# Patient Record
Sex: Female | Born: 1975 | Race: White | Hispanic: No | Marital: Married | Smoking: Never smoker
Health system: Southern US, Community
[De-identification: ages and names within clinical notes are randomized; demographics above are authoritative.]

## PROBLEM LIST (undated history)

## (undated) DIAGNOSIS — F32A Depression, unspecified: Secondary | ICD-10-CM

## (undated) DIAGNOSIS — D649 Anemia, unspecified: Secondary | ICD-10-CM

## (undated) DIAGNOSIS — F419 Anxiety disorder, unspecified: Secondary | ICD-10-CM

## (undated) DIAGNOSIS — T8859XA Other complications of anesthesia, initial encounter: Secondary | ICD-10-CM

## (undated) DIAGNOSIS — G4700 Insomnia, unspecified: Secondary | ICD-10-CM

## (undated) DIAGNOSIS — F329 Major depressive disorder, single episode, unspecified: Secondary | ICD-10-CM

## (undated) DIAGNOSIS — T4145XA Adverse effect of unspecified anesthetic, initial encounter: Secondary | ICD-10-CM

## (undated) HISTORY — DX: Anxiety disorder, unspecified: F41.9

## (undated) HISTORY — DX: Depression, unspecified: F32.A

## (undated) HISTORY — PX: TUBAL LIGATION: SHX77

## (undated) HISTORY — PX: NASAL SINUS SURGERY: SHX719

## (undated) HISTORY — DX: Insomnia, unspecified: G47.00

## (undated) HISTORY — PX: OTHER SURGICAL HISTORY: SHX169

---
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
## 2004-11-16 ENCOUNTER — Other Ambulatory Visit: Admission: RE | Admit: 2004-11-16 | Discharge: 2004-11-16 | Payer: Self-pay | Admitting: Obstetrics and Gynecology

## 2005-04-15 ENCOUNTER — Inpatient Hospital Stay (HOSPITAL_COMMUNITY): Admission: AD | Admit: 2005-04-15 | Discharge: 2005-04-16 | Payer: Self-pay | Admitting: Obstetrics and Gynecology

## 2005-05-31 ENCOUNTER — Inpatient Hospital Stay (HOSPITAL_COMMUNITY): Admission: AD | Admit: 2005-05-31 | Discharge: 2005-05-31 | Payer: Self-pay | Admitting: Obstetrics and Gynecology

## 2005-06-02 ENCOUNTER — Inpatient Hospital Stay (HOSPITAL_COMMUNITY): Admission: AD | Admit: 2005-06-02 | Discharge: 2005-06-04 | Payer: Self-pay | Admitting: Obstetrics and Gynecology

## 2006-08-09 ENCOUNTER — Inpatient Hospital Stay (HOSPITAL_COMMUNITY): Admission: AD | Admit: 2006-08-09 | Discharge: 2006-08-09 | Payer: Self-pay | Admitting: Obstetrics and Gynecology

## 2007-02-16 ENCOUNTER — Inpatient Hospital Stay (HOSPITAL_COMMUNITY): Admission: AD | Admit: 2007-02-16 | Discharge: 2007-02-16 | Payer: Self-pay | Admitting: Obstetrics and Gynecology

## 2007-03-10 ENCOUNTER — Encounter (INDEPENDENT_AMBULATORY_CARE_PROVIDER_SITE_OTHER): Payer: Self-pay | Admitting: Obstetrics and Gynecology

## 2007-03-10 ENCOUNTER — Inpatient Hospital Stay (HOSPITAL_COMMUNITY): Admission: AD | Admit: 2007-03-10 | Discharge: 2007-03-12 | Payer: Self-pay | Admitting: Obstetrics and Gynecology

## 2007-03-11 ENCOUNTER — Encounter (INDEPENDENT_AMBULATORY_CARE_PROVIDER_SITE_OTHER): Payer: Self-pay | Admitting: Obstetrics and Gynecology

## 2007-05-01 ENCOUNTER — Inpatient Hospital Stay (HOSPITAL_COMMUNITY): Admission: AD | Admit: 2007-05-01 | Discharge: 2007-05-01 | Payer: Self-pay | Admitting: Obstetrics and Gynecology

## 2009-03-01 ENCOUNTER — Emergency Department: Payer: Self-pay | Admitting: Emergency Medicine

## 2010-06-23 NOTE — Op Note (Signed)
NAME:  Chelsea Carter, Chelsea Carter                ACCOUNT NO.:  [id number]   MEDICAL RECORD NO.:  [id number]          PATIENT TYPE:  INP   LOCATION:  9147                          FACILITY:  [facility]   PHYSICIAN:  Guy Sandifer. Henderson Cloud, M.D. DATE OF BIRTH:  27-Jun-1975   DATE OF PROCEDURE:  03/11/2007  DATE OF DISCHARGE:                               OPERATIVE REPORT   PREOPERATIVE DIAGNOSIS:  Desires permanent sterilization.   POSTOPERATIVE DIAGNOSIS:  Desires permanent sterilization.   PROCEDURE:  Postpartum bilateral tubal ligation.   SURGEON:  Guy Sandifer. Henderson Cloud, MD   ANESTHESIA:  Epidural, Cristela Blue, MD   ESTIMATED BLOOD LOSS:  Drops.   SPECIMENS:  Bilateral fallopian tube segments to pathology.   INDICATIONS AND CONSENT:  This patient is a 35 year old married white  female G3, P3, status post delivery of a healthy female child on March 10, 2007.  Options to contraception have been discussed.  Risks of tubal  ligation have been reviewed preoperatively including but not limited to  infection, organ damage, bleeding requiring transfusion of blood  products with possible HIV and hepatitis acquisition, DVT, PE and  pneumonia.  Permanence of the procedure, failure rate and increased  ectopic risk have been reviewed.  All questions have been answered and  consent was signed and on the chart.   PROCEDURE IN DETAIL:  The patient was taken to the operating room where  she was identified and her epidural anesthetic was augmented to a  surgical level.  She was placed in the dorsal supine position and  prepped and draped in a sterile fashion.  After testing for adequate  epidural anesthesia the skin is entered through an infraumbilical  incision and dissection is carried out in layers to the peritoneum.  The  peritoneum was bluntly entered.  The right fallopian tube is identified  from cornu to fimbria.  It is grasped in its mid ampullary portion and a  loop of tube is doubly ligated with two free ties  of zero plain suture.  The intervening knuckle of tube is then sharply resected.  Cautery is  used to assure complete hemostasis.  Xylocaine 1% is dripped on the  tubes.  It should also be noted that 0.5% Xylocaine was infiltrated in  the subumbilical skin prior to the incision.  The left fallopian tube  was then identified from cornu to fimbria and a similar procedure was  carried out.  Xylocaine 1% was also dripped onto the  fallopian tube.  Good hemostasis was noted all around.  The fascia is  closed in a running fashion with zero Vicryl suture and the skin is  closed with a 3-0 Vicryl subcuticular stitch.  Dressings were applied.  All counts were correct.  The patient tolerated the procedure well and  is taken to the recovery room in stable condition.      Guy Sandifer Henderson Cloud, M.D.  Electronically Signed     JET/MEDQ  D:  03/11/2007  T:  03/11/2007  Job:  [id number]

## 2010-10-29 LAB — URINALYSIS, ROUTINE W REFLEX MICROSCOPIC
Hgb urine dipstick: NEGATIVE
Nitrite: NEGATIVE
Specific Gravity, Urine: <1.005 — ABNORMAL LOW
Urobilinogen, UA: 0.2

## 2010-10-29 LAB — CBC
HCT: 24.6 — ABNORMAL LOW
MCV: 71.7 — ABNORMAL LOW
Platelets: 233
RBC: 3.69 — ABNORMAL LOW
RDW: 16.9 — ABNORMAL HIGH
WBC: 11.7 — ABNORMAL HIGH

## 2010-10-29 LAB — RPR: RPR Ser Ql: NONREACTIVE

## 2010-10-29 LAB — COMPREHENSIVE METABOLIC PANEL
AST: 24
Albumin: 2.6 — ABNORMAL LOW
Alkaline Phosphatase: 204 — ABNORMAL HIGH
Chloride: 104
GFR calc Af Amer: >60
Potassium: 4.1
Total Bilirubin: 0.5

## 2010-11-24 LAB — URINALYSIS, ROUTINE W REFLEX MICROSCOPIC
Bilirubin Urine: NEGATIVE
Ketones, ur: NEGATIVE
Nitrite: NEGATIVE
Urobilinogen, UA: 0.2
pH: 5.5

## 2011-02-09 HISTORY — PX: BREAST EXCISIONAL BIOPSY: SUR124

## 2012-01-12 ENCOUNTER — Ambulatory Visit: Payer: Self-pay

## 2012-01-17 ENCOUNTER — Ambulatory Visit: Payer: Self-pay

## 2012-02-04 ENCOUNTER — Ambulatory Visit: Payer: Self-pay | Admitting: General Surgery

## 2012-02-04 HISTORY — PX: BREAST CYST EXCISION: SHX579

## 2014-05-28 NOTE — Op Note (Signed)
PATIENT NAME:  [name], Chelsea MR#:  [id number] DATE OF BIRTH:  Feb 14, 1975  DATE OF PROCEDURE:  02/04/2012  PREOPERATIVE DIAGNOSIS: Large fibroadenoma, right breast.   POSTOPERATIVE DIAGNOSIS: Large fibroadenoma, right breast.   PROCEDURE PERFORMED: Excision, right breast fibroadenoma.   SURGEON:  Kathreen CosierS. G. Rebekka Lobello, MD.  ANESTHESIA: Local, with anesthesia sedation.   COMPLICATIONS: None.   ESTIMATED BLOOD LOSS: Minimal.   DRAINS: None.   DESCRIPTION OF PROCEDURE: The patient was placed in the supine position on the operating table. The right breast was prepped and draped out as a sterile field. With adequate sedation, local anesthetic was instilled overlying the visible and palpable mass located in the superior right breast, measuring approximately 2.5 cm in size. After local anesthetic of 0.5% Marcaine and 1% Xylocaine was instilled totaling 9 mL, a satisfactory block was obtained. A skin incision was made and deepened through to expose the mass in question, which was then circumferentially excised out without any difficulty. Bleeding controlled with cautery. The specimen was sent fresh to pathology for processing.  The patient already had a diagnosis on prior core biopsy that this was a fibroadenoma. The wound was irrigated and closed with 2-0            Vicryl over the deeper tissue and the skin with subcuticular 4-0 Vicryl, covered with Dermabond. The procedure was well tolerated. She was subsequently returned to the recovery room in stable condition.   ____________________________ S.Wynona LunaG. Meekah Math, MD sgs:eg D: 02/04/2012 14:04:54 ET T: 02/04/2012 22:23:05 ET JOB#: [id number]  cc: S.G. Evette CristalSankar, MD, <Dictator> [facility] Wynona LunaG Alonni Heimsoth MD ELECTRONICALLY SIGNED 02/07/2012 7:31

## 2015-07-31 ENCOUNTER — Other Ambulatory Visit: Payer: Self-pay | Admitting: Family Medicine

## 2015-07-31 DIAGNOSIS — N63 Unspecified lump in unspecified breast: Secondary | ICD-10-CM

## 2015-08-11 ENCOUNTER — Ambulatory Visit
Admission: RE | Admit: 2015-08-11 | Discharge: 2015-08-11 | Disposition: A | Discharge status: Home / Self Care | Payer: BLUE CROSS/BLUE SHIELD | Source: Ambulatory Visit | Attending: Family Medicine | Admitting: Family Medicine

## 2015-08-11 DIAGNOSIS — N6001 Solitary cyst of right breast: Secondary | ICD-10-CM | POA: Insufficient documentation

## 2015-08-11 DIAGNOSIS — N63 Unspecified lump in unspecified breast: Secondary | ICD-10-CM

## 2015-08-19 ENCOUNTER — Other Ambulatory Visit: Payer: Self-pay | Admitting: Family Medicine

## 2015-08-19 DIAGNOSIS — N6001 Solitary cyst of right breast: Secondary | ICD-10-CM

## 2015-08-27 ENCOUNTER — Ambulatory Visit
Admission: RE | Admit: 2015-08-27 | Discharge: 2015-08-27 | Disposition: A | Discharge status: Home / Self Care | Payer: BLUE CROSS/BLUE SHIELD | Source: Ambulatory Visit | Attending: Family Medicine | Admitting: Family Medicine

## 2015-08-27 DIAGNOSIS — N6001 Solitary cyst of right breast: Secondary | ICD-10-CM | POA: Insufficient documentation

## 2015-08-27 HISTORY — PX: BREAST CYST ASPIRATION: SHX578

## 2017-01-05 ENCOUNTER — Other Ambulatory Visit: Payer: Self-pay | Admitting: Family Medicine

## 2017-01-05 DIAGNOSIS — R922 Inconclusive mammogram: Secondary | ICD-10-CM

## 2017-01-17 ENCOUNTER — Other Ambulatory Visit: Payer: BLUE CROSS/BLUE SHIELD

## 2017-01-27 ENCOUNTER — Ambulatory Visit
Admission: RE | Admit: 2017-01-27 | Discharge: 2017-01-27 | Disposition: A | Discharge status: Home / Self Care | Payer: BLUE CROSS/BLUE SHIELD | Source: Ambulatory Visit | Attending: Family Medicine | Admitting: Family Medicine

## 2017-01-27 DIAGNOSIS — R922 Inconclusive mammogram: Secondary | ICD-10-CM | POA: Diagnosis present

## 2017-01-27 DIAGNOSIS — R9230 Dense breasts, unspecified: Secondary | ICD-10-CM

## 2017-07-11 ENCOUNTER — Other Ambulatory Visit: Payer: Self-pay | Admitting: Family Medicine

## 2017-07-11 DIAGNOSIS — R928 Other abnormal and inconclusive findings on diagnostic imaging of breast: Secondary | ICD-10-CM

## 2017-08-09 ENCOUNTER — Inpatient Hospital Stay: Admission: RE | Admit: 2017-08-09 | Payer: BLUE CROSS/BLUE SHIELD | Source: Ambulatory Visit

## 2017-09-15 ENCOUNTER — Ambulatory Visit
Admission: RE | Admit: 2017-09-15 | Discharge: 2017-09-15 | Disposition: A | Discharge status: Home / Self Care | Payer: BLUE CROSS/BLUE SHIELD | Source: Ambulatory Visit | Attending: Family Medicine | Admitting: Family Medicine

## 2017-09-15 DIAGNOSIS — R928 Other abnormal and inconclusive findings on diagnostic imaging of breast: Secondary | ICD-10-CM

## 2017-10-22 IMAGING — MG MM DIGITAL DIAGNOSTIC BILAT W/ TOMO W/ CAD
8 of 15 series · 8 of 35 positions shown · non-contrast
Comparison: Previous exam(s).

CLINICAL DATA: 39-year-old female with tender, palpable abnormality
of the right breast at 11 o'clock

EXAM:
2D DIGITAL DIAGNOSTIC BILATERAL MAMMOGRAM WITH CAD AND ADJUNCT TOMO
ULTRASOUND RIGHT BREAST

[R MLO synth-2D (1 of 2)]
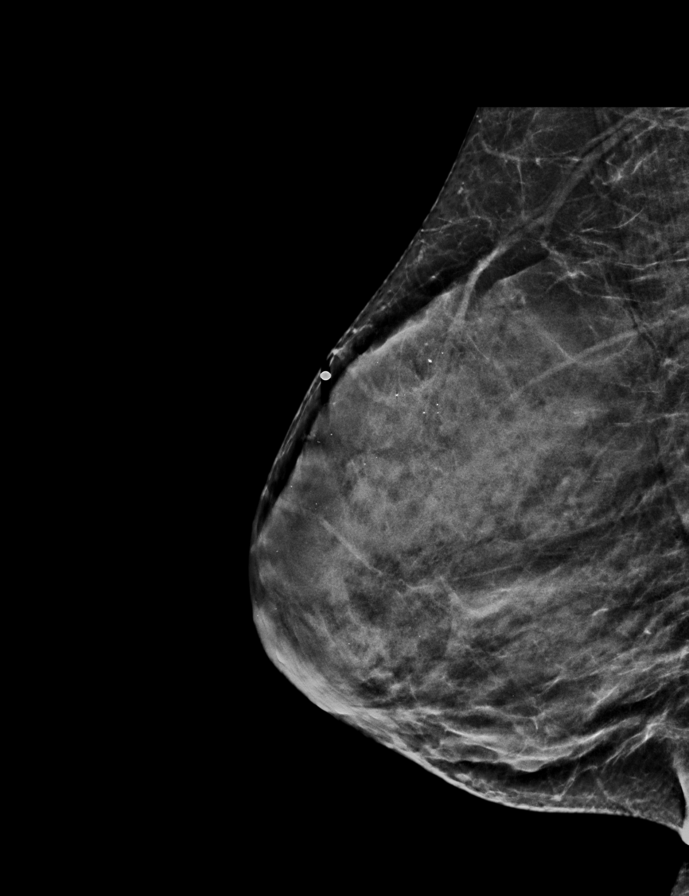

[R CC]
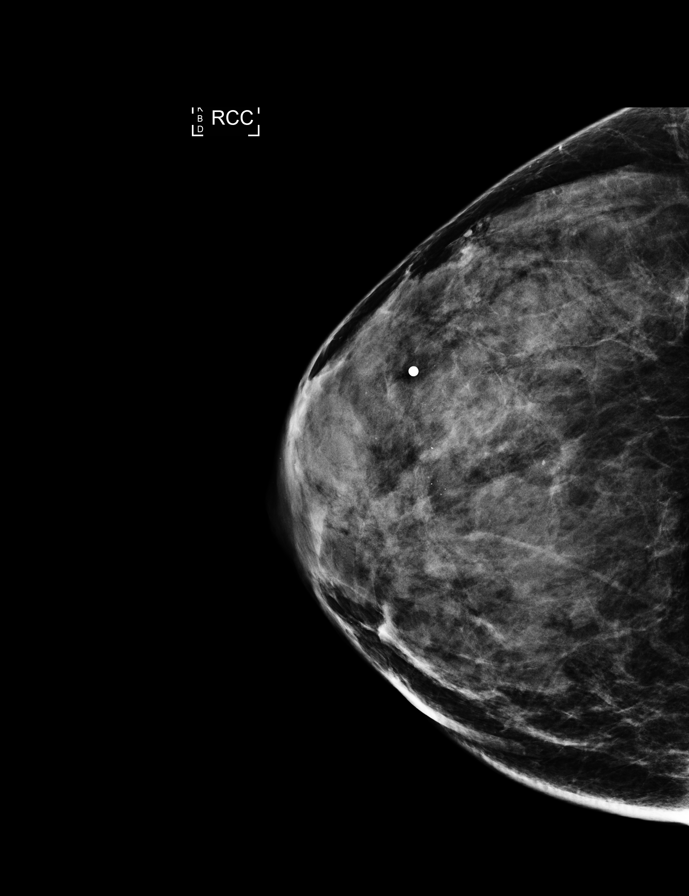

[L MLO synth-2D]
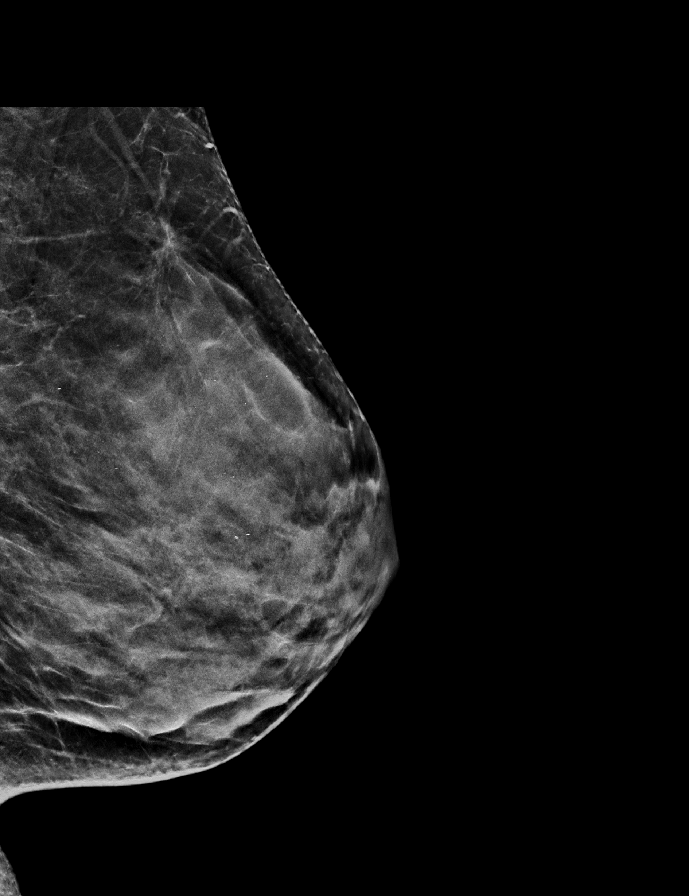

[L CC synth-2D]
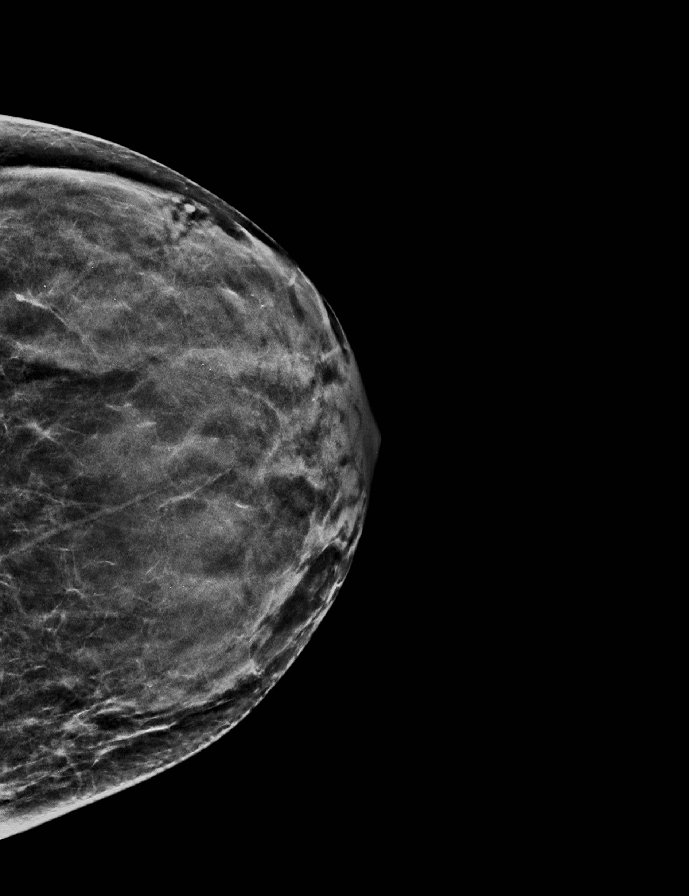

[R MLO synth-2D (2 of 2)]
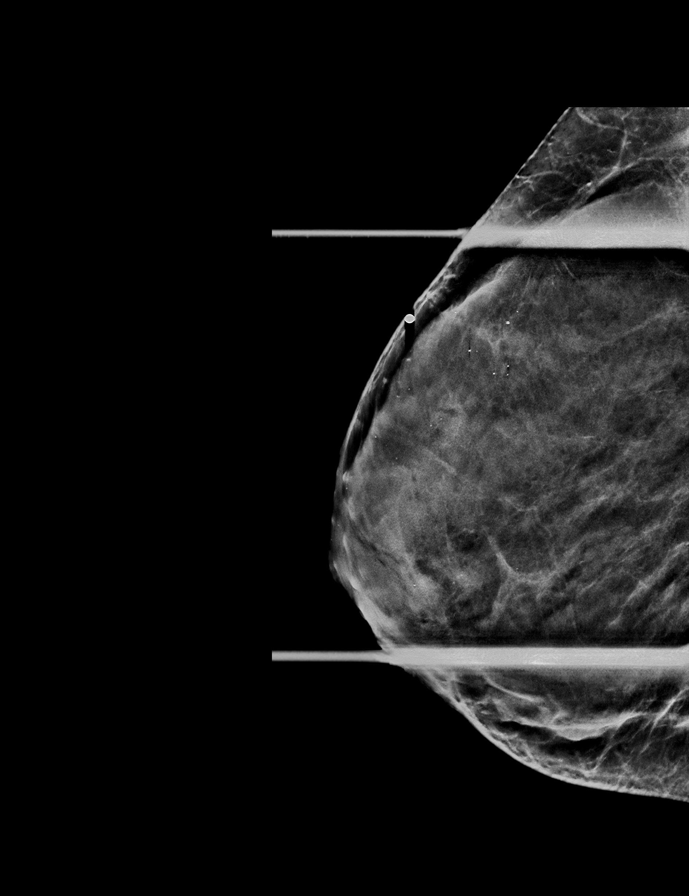

[R CC synth-2D]
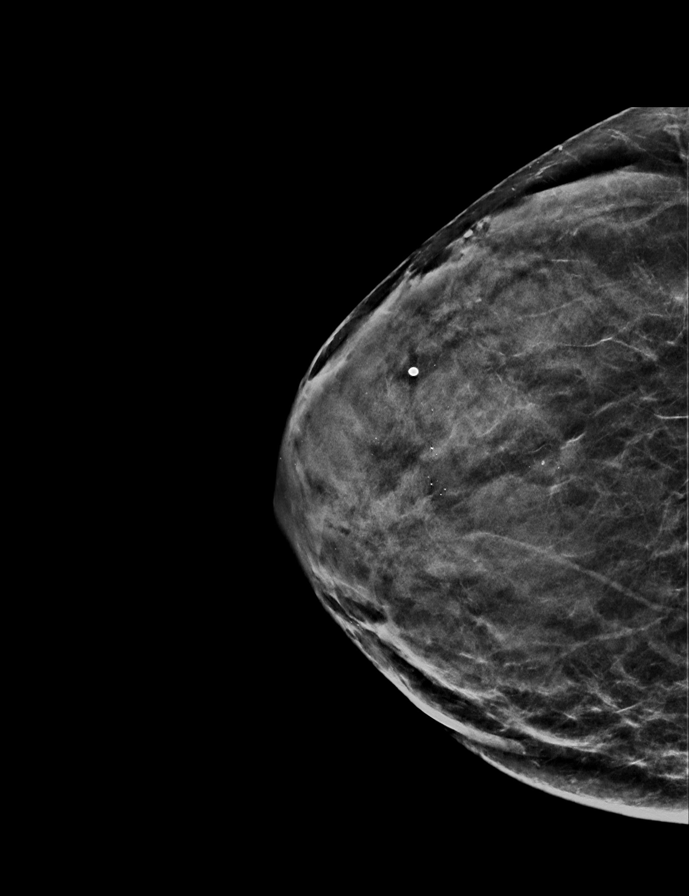

[L CC]
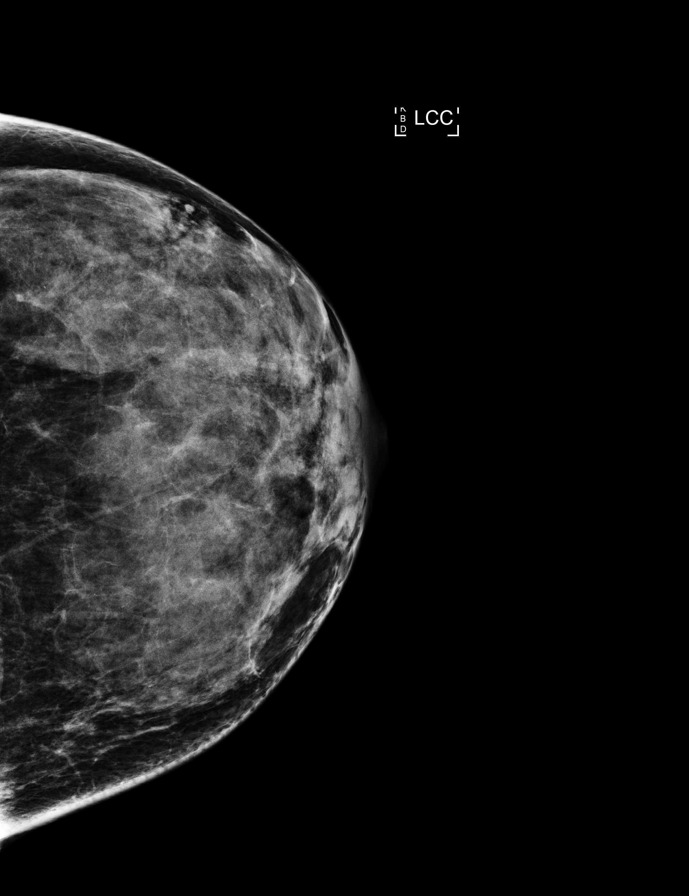

[R MLO]
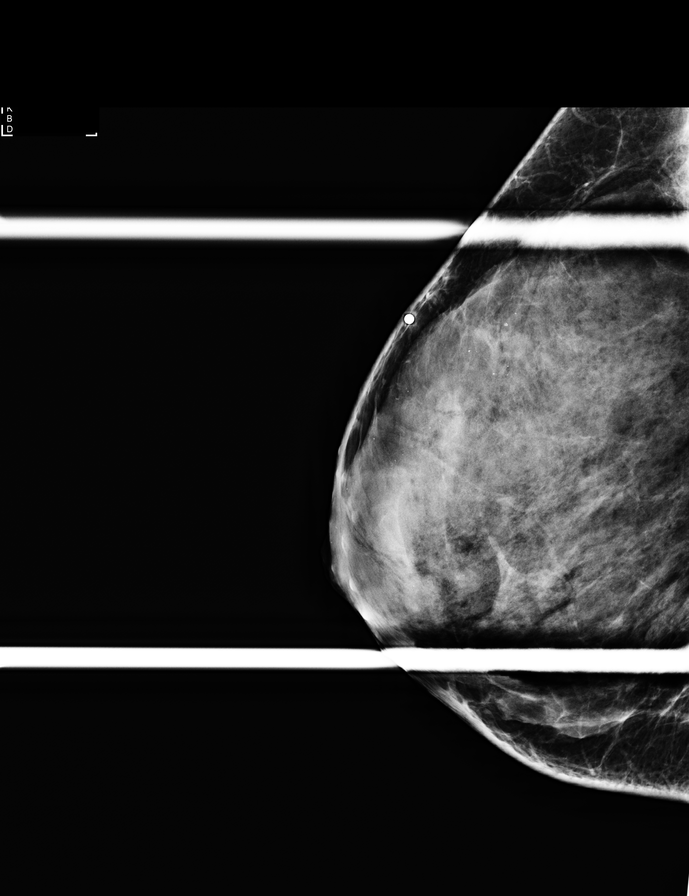

[8 of 35 positions shown; findings below may reference images not displayed]

ACR Breast Density Category d: The breast tissue is extremely dense,
which lowers the sensitivity of mammography.
FINDINGS: No suspicious mass, calcifications, or other abnormality is
identified within the left breast. Underlying the palpable marker of
the upper, outer right breast, there is a 2.5 cm oval, circumscribed
mass. Adjacent to this mass within the more anterior, inferior right
breast is an additional oval, circumscribed mass.

Mammographic images were processed with CAD.

On physical exam, a mobile mass is felt in the patient's area of
concern within the upper, outer right breast.

Targeted ultrasound is performed, showing a mildly complicated cyst
at 11 o'clock, 3 cm from the nipple measuring 2.3 x 1.6 x 2.1 cm. An
additional cyst containing several thin internal septations is
identified at 11 o'clock, 1 cm from the nipple measuring 1.8 x 2.6 x
1.4 cm. No internal vascularity is seen within either cyst. These
cysts are thought to correspond to the masses seen mammographically
and the palpable abnormality.
IMPRESSION: Right breast cysts.

RECOMMENDATION:
Ultrasound-guided aspiration of the right breast cysts at 11
o'clock, 3 cm from the nipple and 11 o'clock, 1 cm from the nipple
for symptomatic relief.

I have discussed the findings and recommendations with the patient.
Results were also provided in writing at the conclusion of the
visit. If applicable, a reminder letter will be sent to the patient
regarding the next appointment.

BI-RADS CATEGORY  2: Benign.

## 2018-01-16 ENCOUNTER — Other Ambulatory Visit: Payer: Self-pay | Admitting: Family Medicine

## 2018-01-16 DIAGNOSIS — R921 Mammographic calcification found on diagnostic imaging of breast: Secondary | ICD-10-CM

## 2018-02-06 ENCOUNTER — Other Ambulatory Visit: Payer: Self-pay | Admitting: Diagnostic Radiology

## 2018-02-06 DIAGNOSIS — N6310 Unspecified lump in the right breast, unspecified quadrant: Secondary | ICD-10-CM

## 2018-02-06 DIAGNOSIS — R921 Mammographic calcification found on diagnostic imaging of breast: Secondary | ICD-10-CM

## 2018-02-15 ENCOUNTER — Other Ambulatory Visit: Payer: BLUE CROSS/BLUE SHIELD

## 2018-02-17 ENCOUNTER — Ambulatory Visit
Admission: RE | Admit: 2018-02-17 | Discharge: 2018-02-17 | Disposition: A | Discharge status: Home / Self Care | Payer: BLUE CROSS/BLUE SHIELD | Source: Ambulatory Visit | Attending: Diagnostic Radiology | Admitting: Diagnostic Radiology

## 2018-02-17 DIAGNOSIS — N6310 Unspecified lump in the right breast, unspecified quadrant: Secondary | ICD-10-CM | POA: Diagnosis present

## 2018-02-17 DIAGNOSIS — R921 Mammographic calcification found on diagnostic imaging of breast: Secondary | ICD-10-CM | POA: Diagnosis present

## 2018-12-05 ENCOUNTER — Other Ambulatory Visit: Payer: Self-pay

## 2018-12-05 ENCOUNTER — Inpatient Hospital Stay: Payer: BC Managed Care – PPO | Attending: Oncology | Admitting: Oncology

## 2018-12-05 ENCOUNTER — Encounter: Payer: Self-pay | Admitting: Oncology

## 2018-12-05 ENCOUNTER — Inpatient Hospital Stay: Payer: BC Managed Care – PPO

## 2018-12-05 VITALS — BP 115/85 | HR 80 | Temp 98.7°F | Resp 18 | Ht 67.0 in | Wt 147.5 lb

## 2018-12-05 DIAGNOSIS — F419 Anxiety disorder, unspecified: Secondary | ICD-10-CM | POA: Insufficient documentation

## 2018-12-05 DIAGNOSIS — F329 Major depressive disorder, single episode, unspecified: Secondary | ICD-10-CM | POA: Diagnosis not present

## 2018-12-05 DIAGNOSIS — R238 Other skin changes: Secondary | ICD-10-CM

## 2018-12-05 DIAGNOSIS — K5900 Constipation, unspecified: Secondary | ICD-10-CM

## 2018-12-05 DIAGNOSIS — R233 Spontaneous ecchymoses: Secondary | ICD-10-CM

## 2018-12-05 DIAGNOSIS — N939 Abnormal uterine and vaginal bleeding, unspecified: Secondary | ICD-10-CM | POA: Diagnosis not present

## 2018-12-05 DIAGNOSIS — D500 Iron deficiency anemia secondary to blood loss (chronic): Secondary | ICD-10-CM | POA: Diagnosis not present

## 2018-12-05 DIAGNOSIS — Z808 Family history of malignant neoplasm of other organs or systems: Secondary | ICD-10-CM | POA: Diagnosis not present

## 2018-12-05 LAB — APTT: aPTT: 29 seconds (ref 24–36)

## 2018-12-05 LAB — FERRITIN: Ferritin: 3 ng/mL — ABNORMAL LOW (ref 11–307)

## 2018-12-05 LAB — COMPREHENSIVE METABOLIC PANEL
ALT: 15 U/L (ref 0–44)
AST: 15 U/L (ref 15–41)
Albumin: 4.2 g/dL (ref 3.5–5.0)
Alkaline Phosphatase: 62 U/L (ref 38–126)
Anion gap: 7 (ref 5–15)
BUN: 6 mg/dL (ref 6–20)
CO2: 26 mmol/L (ref 22–32)
Calcium: 8.9 mg/dL (ref 8.9–10.3)
Chloride: 105 mmol/L (ref 98–111)
Creatinine, Ser: 0.78 mg/dL (ref 0.44–1.00)
GFR calc Af Amer: >60 mL/min (ref >60)
GFR calc non Af Amer: >60 mL/min (ref >60)
Glucose, Bld: 103 mg/dL — ABNORMAL HIGH (ref 70–99)
Potassium: 3.6 mmol/L (ref 3.5–5.1)
Sodium: 138 mmol/L (ref 135–145)
Total Bilirubin: 0.3 mg/dL (ref 0.3–1.2)
Total Protein: 7.8 g/dL (ref 6.5–8.1)

## 2018-12-05 LAB — CBC WITH DIFFERENTIAL/PLATELET
Abs Immature Granulocytes: 0.01 10*3/uL (ref 0.00–0.07)
Basophils Absolute: 0 10*3/uL (ref 0.0–0.1)
Basophils Relative: 1 %
Eosinophils Absolute: 0.1 10*3/uL (ref 0.0–0.5)
Eosinophils Relative: 1 %
HCT: 27.7 % — ABNORMAL LOW (ref 36.0–46.0)
Hemoglobin: 8 g/dL — ABNORMAL LOW (ref 12.0–15.0)
Immature Granulocytes: 0 %
Lymphocytes Relative: 22 %
Lymphs Abs: 1.4 10*3/uL (ref 0.7–4.0)
MCH: 21.1 pg — ABNORMAL LOW (ref 26.0–34.0)
MCHC: 28.9 g/dL — ABNORMAL LOW (ref 30.0–36.0)
MCV: 72.9 fL — ABNORMAL LOW (ref 80.0–100.0)
Monocytes Absolute: 0.5 10*3/uL (ref 0.1–1.0)
Monocytes Relative: 8 %
Neutro Abs: 4.3 10*3/uL (ref 1.7–7.7)
Neutrophils Relative %: 68 %
Platelets: 355 10*3/uL (ref 150–400)
RBC: 3.8 MIL/uL — ABNORMAL LOW (ref 3.87–5.11)
RDW: 15.8 % — ABNORMAL HIGH (ref 11.5–15.5)
WBC: 6.3 10*3/uL (ref 4.0–10.5)
nRBC: 0 % (ref 0.0–0.2)

## 2018-12-05 LAB — PROTIME-INR
INR: 1 (ref 0.8–1.2)
Prothrombin Time: 13.2 seconds (ref 11.4–15.2)

## 2018-12-05 LAB — IRON AND TIBC
Iron: 19 ug/dL — ABNORMAL LOW (ref 28–170)
Saturation Ratios: 4 % — ABNORMAL LOW (ref 10.4–31.8)
TIBC: 449 ug/dL (ref 250–450)
UIBC: 430 ug/dL

## 2018-12-05 LAB — TSH: TSH: 0.548 u[IU]/mL (ref 0.350–4.500)

## 2018-12-05 NOTE — Progress Notes (Signed)
Patient reports having a history of anemia with intolerance to po iron due to constipation.

## 2018-12-06 LAB — VON WILLEBRAND PANEL
Coagulation Factor VIII: 191 % — ABNORMAL HIGH (ref 56–140)
Ristocetin Co-factor, Plasma: 67 % (ref 50–200)
Von Willebrand Antigen, Plasma: 103 % (ref 50–200)

## 2018-12-06 LAB — COAG STUDIES INTERP REPORT

## 2018-12-07 DIAGNOSIS — D500 Iron deficiency anemia secondary to blood loss (chronic): Secondary | ICD-10-CM | POA: Insufficient documentation

## 2018-12-07 NOTE — Progress Notes (Signed)
Hematology/Oncology Consult note [facility] Telephone:([phone] [phone]   Patient Care Team: Joycelyn Rua, MD as PCP - General (Family Medicine)  REFERRING PROVIDER: Deborha Payment, PA-C CHIEF COMPLAINTS/REASON FOR VISIT:  Evaluation of iron deficiency anemia  HISTORY OF PRESENTING ILLNESS:  Chelsea Carter is a  43 y.o.  female with PMH listed below was seen in consultation at the request of Deborha Payment, PA-C   for evaluation of iron deficiency anemia.   Reviewed patient's recent labs  12/04/2018 [facility] clinic labs revealed anemia with hemoglobin of 7.9, MCV 74.3 Iron panel showed iron 15, TIBC 495.3, ferritin 2. Patient was started on ferrous sulfate 325 mg 1-2 times daily.  However patient reports not tolerating due to GI side effects-constipation Reviewed patient's previous labs ordered by primary care physician's office,  No recent labs.  In 2009, patient had hemoglobin 7.9 with MCV 71.7.  Anemia is chronic. No aggravating or improving factors.  Associated signs and symptoms: Patient reports fatigue.  Denies SOB with exertion.  Denies weight loss, easy bruising, hematochezia, hemoptysis, hematuria. Context:  History of iron deficiency: Yes, not tolerating p.o. iron tablets. Rectal bleeding: Denies Menstrual bleeding/ Vaginal bleeding : Heavy menstrual bleeding.  Easy bruising Hematemesis or hemoptysis : denies Blood in urine : denies   Last endoscopy: Never had  Fatigue: Yes.  SOB: deneis  Patient recently see primary care provider due to flulike symptoms.  Was diagnosed with acute sinusitis.  Taking Augmentin.    Review of Systems  Constitutional: Positive for fatigue. Negative for appetite change, chills and fever.       Flulike symptoms  HENT:   Negative for hearing loss and voice change.        Sinusitis on antibiotics  Eyes: Negative for eye problems.  Respiratory: Negative for chest tightness and cough.    Cardiovascular: Negative for chest pain.  Gastrointestinal: Negative for abdominal distention, abdominal pain and blood in stool.  Endocrine: Negative for hot flashes.  Genitourinary: Negative for difficulty urinating and frequency.   Musculoskeletal: Negative for arthralgias.  Skin: Negative for itching and rash.  Neurological: Negative for extremity weakness.  Hematological: Negative for adenopathy.  Psychiatric/Behavioral: Negative for confusion.    MEDICAL HISTORY:  Past Medical History:  Diagnosis Date  . Anxiety   . Depression   . Insomnia     SURGICAL HISTORY: Past Surgical History:  Procedure Laterality Date  . BREAST CYST ASPIRATION Right 08/27/2015   neg  . BREAST CYST EXCISION Right 02/04/2012   removal of fibroadenoma    SOCIAL HISTORY: Social History   Socioeconomic History  . Marital status: Married    Spouse name: Not on file  . Number of children: Not on file  . Years of education: Not on file  . Highest education level: Not on file  Occupational History  . Not on file  Social Needs  . Financial resource strain: Not on file  . Food insecurity    Worry: Not on file    Inability: Not on file  . Transportation needs    Medical: Not on file    Non-medical: Not on file  Tobacco Use  . Smoking status: Never Smoker  Substance and Sexual Activity  . Alcohol use: Never    Frequency: Never  . Drug use: Never  . Sexual activity: Yes  Lifestyle  . Physical activity    Days per week: Not on file    Minutes per session: Not on file  .  Stress: Not on file  Relationships  . Social Musicianconnections    Talks on phone: Not on file    Gets together: Not on file    Attends religious service: Not on file    Active member of club or organization: Not on file    Attends meetings of clubs or organizations: Not on file    Relationship status: Not on file  . Intimate partner violence    Fear of current or ex partner: Not on file    Emotionally abused: Not on file     Physically abused: Not on file    Forced sexual activity: Not on file  Other Topics Concern  . Not on file  Social History Narrative  . Not on file    FAMILY HISTORY: Family History  Problem Relation Age of Onset  . Brain cancer Maternal Uncle   . Breast cancer Neg Hx     ALLERGIES:  is allergic to ondansetron hcl and zolpidem.  MEDICATIONS:  Current Outpatient Medications  Medication Sig Dispense Refill  . amoxicillin-clavulanate (AUGMENTIN) 875-125 MG tablet Take by mouth.    Marland Kitchen. buPROPion (WELLBUTRIN XL) 300 MG 24 hr tablet     . clonazePAM (KLONOPIN) 0.5 MG tablet Take by mouth.    . escitalopram (LEXAPRO) 20 MG tablet Take by mouth.    . ferrous sulfate 325 (65 FE) MG tablet Take by mouth.    . fluticasone (FLONASE) 50 MCG/ACT nasal spray Place into the nose.    . magic mouthwash SOLN Swish and spit 5 mLs every 6 (six) hours as needed for Pain Nystatin 100,000 units/mL = 30 mL Hydrocortisone = 60 mg Viscous Xylocaine 30 ml QS with diphenhydramine HCl syrup to volume of 240 mL    . temazepam (RESTORIL) 15 MG capsule      No current facility-administered medications for this visit.      PHYSICAL EXAMINATION: ECOG PERFORMANCE STATUS: 0 - Asymptomatic Vitals:   12/05/18 1505  BP: 115/85  Pulse: 80  Resp: 18  Temp: 98.7 F (37.1 C)   Filed Weights   12/05/18 1505  Weight: 147 lb 8 oz (66.9 kg)    Physical Exam Constitutional:      General: She is not in acute distress. HENT:     Head: Normocephalic and atraumatic.  Eyes:     General: No scleral icterus.    Pupils: Pupils are equal, round, and reactive to light.  Neck:     Musculoskeletal: Normal range of motion and neck supple.  Cardiovascular:     Rate and Rhythm: Normal rate and regular rhythm.     Heart sounds: Normal heart sounds.  Pulmonary:     Effort: Pulmonary effort is normal. No respiratory distress.     Breath sounds: No wheezing.  Abdominal:     General: Bowel sounds are normal. There is  no distension.     Palpations: Abdomen is soft. There is no mass.     Tenderness: There is no abdominal tenderness.  Musculoskeletal: Normal range of motion.        General: No deformity.  Skin:    General: Skin is warm and dry.     Coloration: Skin is pale.     Findings: No erythema or rash.  Neurological:     Mental Status: She is alert and oriented to person, place, and time.     Cranial Nerves: No cranial nerve deficit.     Coordination: Coordination normal.  Psychiatric:  Behavior: Behavior normal.        Thought Content: Thought content normal.       CMP Latest Ref Rng & Units 12/05/2018  Glucose 70 - 99 mg/dL 103(H)  BUN 6 - 20 mg/dL 6  Creatinine 0.44 - 1.00 mg/dL 0.78  Sodium 135 - 145 mmol/L 138  Potassium 3.5 - 5.1 mmol/L 3.6  Chloride 98 - 111 mmol/L 105  CO2 22 - 32 mmol/L 26  Calcium 8.9 - 10.3 mg/dL 8.9  Total Protein 6.5 - 8.1 g/dL 7.8  Total Bilirubin 0.3 - 1.2 mg/dL 0.3  Alkaline Phos 38 - 126 U/L 62  AST 15 - 41 U/L 15  ALT 0 - 44 U/L 15   CBC Latest Ref Rng & Units 12/05/2018  WBC 4.0 - 10.5 K/uL 6.3  Hemoglobin 12.0 - 15.0 g/dL 8.0(L)  Hematocrit 36.0 - 46.0 % 27.7(L)  Platelets 150 - 400 K/uL 355     LABORATORY DATA:  I have reviewed the data as listed Lab Results  Component Value Date   WBC 6.3 12/05/2018   HGB 8.0 (L) 12/05/2018   HCT 27.7 (L) 12/05/2018   MCV 72.9 (L) 12/05/2018   PLT 355 12/05/2018   Recent Labs    12/05/18 1548  NA 138  K 3.6  CL 105  CO2 26  GLUCOSE 103*  BUN 6  CREATININE 0.78  CALCIUM 8.9  GFRNONAA >60  GFRAA >60  PROT 7.8  ALBUMIN 4.2  AST 15  ALT 15  ALKPHOS 62  BILITOT 0.3   Iron/TIBC/Ferritin/ %Sat    Component Value Date/Time   IRON 19 (L) 12/05/2018 1548   TIBC 449 12/05/2018 1548   FERRITIN 3 (L) 12/05/2018 1548   IRONPCTSAT 4 (L) 12/05/2018 1548     No results found.    ASSESSMENT & PLAN:  1. Iron deficiency anemia due to chronic blood loss   2. Abnormal uterine  bleeding   3. Easy bruising    #Iron deficiency anemia Labs are reviewed and discussed with patient. Consistent with iron deficiency anemia.  She does not tolerate p.o. iron supplementation. Plan IV iron with Venofer 200mg  weekly x 4 doses. Allergy reactions/infusion reaction including anaphylactic reaction discussed with patient. Other side effects include but not limited to high blood pressure, skin rash, weight gain, leg swelling, etc. Patient voices understanding and willing to proceed.  #Easy bruising, and abnormal uterine bleeding. Check TSH, von Willebrand panel, PT/INR, PTT, repeat CBC and iron panel as pre -iron infusion baseline. Recommend patient to establish care with GYN for further evaluation for her abnormal uterine bleeding. Labs reviewed.  No von Willebrand disease.  Normal coagulation.  Normal liver function normal TSH  Orders Placed This Encounter  Procedures  . CBC with Differential/Platelet    Standing Status:   Future    Number of Occurrences:   1    Standing Expiration Date:   12/05/2019  . Iron and TIBC    Standing Status:   Future    Number of Occurrences:   1    Standing Expiration Date:   12/05/2019  . Ferritin    Standing Status:   Future    Number of Occurrences:   1    Standing Expiration Date:   12/05/2019  . Protime-INR    Standing Status:   Future    Number of Occurrences:   1    Standing Expiration Date:   12/05/2019  . APTT    Standing Status:   Future  Number of Occurrences:   1    Standing Expiration Date:   12/05/2019  . TSH    Standing Status:   Future    Number of Occurrences:   1    Standing Expiration Date:   12/05/2019  . Von Willebrand panel    Standing Status:   Future    Number of Occurrences:   1    Standing Expiration Date:   12/05/2019  . Comprehensive metabolic panel    Standing Status:   Future    Number of Occurrences:   1    Standing Expiration Date:   12/05/2019  . CBC with Differential/Platelet    Standing Status:    Future    Standing Expiration Date:   12/05/2019  . Iron and TIBC    Standing Status:   Future    Standing Expiration Date:   12/05/2019  . Ferritin    Standing Status:   Future    Standing Expiration Date:   12/05/2019    All questions were answered. The patient knows to call the clinic with any problems questions or concerns.  Cc Deborha Payment, PA-C  Return of visit: 4 months Thank you for this kind referral and the opportunity to participate in the care of this patient. A copy of today's note is routed to referring provider  Total face to face encounter time for this patient visit was 60 min. >50% of the time was  spent in counseling and coordination of care.    Rickard Patience, MD, PhD Hematology Oncology [facility] Pager- [phone] 12/07/2018

## 2018-12-08 ENCOUNTER — Telehealth: Payer: Self-pay

## 2018-12-08 NOTE — Telephone Encounter (Signed)
-----   Message from Earlie Server, MD sent at 12/08/2018  8:56 AM EDT ----- Please let patient know that her blood work showed normal coagulation function, normal thyroid function. Recommend her to follow up with GYN to further evaluation of heavy menstrual period- I believe she has an appt.  Same plan with IV Iron. Follow up as planned.

## 2018-12-08 NOTE — Telephone Encounter (Signed)
LM informing labs within normal limits and keep scheduled appts.

## 2018-12-11 ENCOUNTER — Other Ambulatory Visit: Payer: Self-pay

## 2018-12-12 ENCOUNTER — Inpatient Hospital Stay: Payer: BC Managed Care – PPO | Attending: Oncology

## 2018-12-12 ENCOUNTER — Other Ambulatory Visit: Payer: Self-pay

## 2018-12-12 VITALS — BP 107/68 | HR 68 | Temp 98.0°F | Resp 18

## 2018-12-12 DIAGNOSIS — D500 Iron deficiency anemia secondary to blood loss (chronic): Secondary | ICD-10-CM | POA: Diagnosis present

## 2018-12-12 DIAGNOSIS — N939 Abnormal uterine and vaginal bleeding, unspecified: Secondary | ICD-10-CM | POA: Diagnosis present

## 2018-12-12 MED ORDER — SODIUM CHLORIDE 0.9 % IV SOLN
Freq: Once | INTRAVENOUS | Status: AC
  Administered 2018-12-12: 14:00:00 via INTRAVENOUS
  Filled 2018-12-12: qty 250

## 2018-12-12 MED ORDER — IRON SUCROSE 20 MG/ML IV SOLN
200.0000 mg | Freq: Once | INTRAVENOUS | Status: AC
  Administered 2018-12-12: 14:00:00 200 mg via INTRAVENOUS
  Filled 2018-12-12: qty 10

## 2018-12-19 ENCOUNTER — Inpatient Hospital Stay: Payer: BC Managed Care – PPO

## 2018-12-19 ENCOUNTER — Other Ambulatory Visit: Payer: Self-pay

## 2018-12-19 VITALS — BP 117/74 | HR 80 | Temp 98.0°F | Resp 20

## 2018-12-19 DIAGNOSIS — D500 Iron deficiency anemia secondary to blood loss (chronic): Secondary | ICD-10-CM | POA: Diagnosis not present

## 2018-12-19 MED ORDER — SODIUM CHLORIDE 0.9 % IV SOLN
Freq: Once | INTRAVENOUS | Status: AC
  Administered 2018-12-19: 14:00:00 via INTRAVENOUS
  Filled 2018-12-19: qty 250

## 2018-12-19 MED ORDER — IRON SUCROSE 20 MG/ML IV SOLN
200.0000 mg | Freq: Once | INTRAVENOUS | Status: AC
  Administered 2018-12-19: 200 mg via INTRAVENOUS
  Filled 2018-12-19: qty 10

## 2018-12-26 ENCOUNTER — Other Ambulatory Visit: Payer: Self-pay

## 2018-12-26 ENCOUNTER — Inpatient Hospital Stay: Payer: BC Managed Care – PPO

## 2018-12-26 VITALS — BP 112/67 | HR 64 | Temp 97.2°F | Resp 18

## 2018-12-26 DIAGNOSIS — D500 Iron deficiency anemia secondary to blood loss (chronic): Secondary | ICD-10-CM | POA: Diagnosis not present

## 2018-12-26 MED ORDER — IRON SUCROSE 20 MG/ML IV SOLN
200.0000 mg | Freq: Once | INTRAVENOUS | Status: AC
  Administered 2018-12-26: 200 mg via INTRAVENOUS
  Filled 2018-12-26: qty 10

## 2018-12-26 MED ORDER — SODIUM CHLORIDE 0.9 % IV SOLN
Freq: Once | INTRAVENOUS | Status: AC
  Administered 2018-12-26: 14:00:00 via INTRAVENOUS
  Filled 2018-12-26: qty 250

## 2019-01-02 ENCOUNTER — Inpatient Hospital Stay: Payer: BC Managed Care – PPO

## 2019-01-02 ENCOUNTER — Other Ambulatory Visit: Payer: Self-pay

## 2019-01-02 VITALS — BP 116/79 | HR 78 | Temp 98.2°F | Resp 18

## 2019-01-02 DIAGNOSIS — D500 Iron deficiency anemia secondary to blood loss (chronic): Secondary | ICD-10-CM | POA: Diagnosis not present

## 2019-01-02 MED ORDER — IRON SUCROSE 20 MG/ML IV SOLN
200.0000 mg | Freq: Once | INTRAVENOUS | Status: AC
  Administered 2019-01-02: 200 mg via INTRAVENOUS
  Filled 2019-01-02: qty 10

## 2019-01-02 MED ORDER — SODIUM CHLORIDE 0.9 % IV SOLN
Freq: Once | INTRAVENOUS | Status: AC
  Administered 2019-01-02: 14:00:00 via INTRAVENOUS
  Filled 2019-01-02: qty 250

## 2019-02-08 ENCOUNTER — Telehealth: Payer: Self-pay | Admitting: *Deleted

## 2019-02-08 NOTE — Telephone Encounter (Signed)
She sees me in February. She can keep that. I don't see pending iron infusion around her surgery date.

## 2019-02-08 NOTE — Telephone Encounter (Signed)
Patient stes she gets Iron infusions and that she is having surgery 1/18 for a hysterectomy and that she has appointment for infusion and won't be able to get out due to the surgery and she is asking if she could come before then or what to do. Please advise

## 2019-02-08 NOTE — Telephone Encounter (Signed)
Patient informed of doctor response and is ok with that plan

## 2019-02-13 ENCOUNTER — Telehealth: Payer: Self-pay

## 2019-02-13 NOTE — Telephone Encounter (Signed)
Patient's OB called to inform that she will be getting hysterectomy on 1/18 and would like her to receive IV iron before.  Dr. Cathie Hoops wants her to bee seen this week with labs (prior)/MD/Venofer on day 2.    Patient scheduled for labs on 1/6 and MD/Venofer on 1/8.

## 2019-02-13 NOTE — H&P (Addendum)
Chief Complaint:   Chelsea Carter is a 44 y.o. female presenting with Pre Op Consulting   History of Present Illness: Patient presents for a pre-operative visit to sign consents for a Total Laparoscopic Hysterectomy with Bilateral Salpingectomy. Indications for surgery are patient's new onset menorrhagia with irregular cycle, secondary iron-deficiency anemia. Has required iv iron-infusions and has Hx of Depo (gave migraines, hair loss), Hx of OCPs but developed a skin discoloration on her face which she thinks is related.  Patient has opted for definitive management.  Body mass index is 23.15 kg/m.  Workup: EMBx: 01/2019 benign  TVUS 01/2019:  Uterus=8.13 x 5.84 x 5.71cm Uterus anteverted Endometrium=12.50mm Free fluid seen in ACDS Rt simple ovarian cyst=2cm Lt simple paraovarian cyst=1.3cm  SIS 01/2019: The uterus had a maximal endometrial thickness of 3 mm. The endometrial contour appeared regular and generally normal. Endometrial findings were unremarkable  Past Medical History:  has a past medical history of Anxiety, Iron deficiency anemia, and Menorrhagia.  Past Surgical History:  has a past surgical history that includes other surgery. Family History: family history is not on file. Social History:  reports that she has never smoked. She has never used smokeless tobacco. She reports that she does not drink alcohol or use drugs. OB/GYN History:  OB History    Gravida  3   Para  3   Term  3   Preterm      AB      Living  3     SAB      TAB      Ectopic      Molar      Multiple      Live Births           Allergies: is allergic to zolpidem. Medications: Current Outpatient Medications:  .  buPROPion (WELLBUTRIN XL) 300 MG XL tablet, , Disp: , Rfl:  .  clonazePAM (KLONOPIN) 0.5 MG tablet, Take by mouth, Disp: , Rfl:  .  duke's magic mouthwash suspension, Swish and spit 5 mLs every 6 (six) hours as needed for Pain Nystatin 100,000 units/mL = 30 mL  Hydrocortisone = 60 mg Viscous Xylocaine 30 ml QS with diphenhydramine HCl syrup to volume of 240 mL, Disp: 240 mL, Rfl: 0 .  escitalopram oxalate (LEXAPRO) 20 MG tablet, Take by mouth, Disp: , Rfl:  .  ferrous sulfate 325 (65 FE) MG EC tablet, Take 1 tablet (325 mg total) by mouth daily with breakfast, Disp: 30 tablet, Rfl: 11 .  fluconazole (DIFLUCAN) 150 MG tablet, , Disp: , Rfl:  .  fluticasone propionate (FLONASE) 50 mcg/actuation nasal spray, Place 2 sprays into both nostrils once daily, Disp: 16 g, Rfl: 1 .  guaifenesin/dextromethorphan (MUCINEX DM ORAL), Take by mouth, Disp: , Rfl:  .  temazepam (RESTORIL) 15 mg capsule, , Disp: , Rfl:   Review of Systems: No SOB, no palpitations or chest pain, no new lower extremity edema, no nausea or vomiting or bowel or bladder complaints. See HPI for gyn specific ROS.   Exam:   BP 118/74   Pulse 75   Ht 170.2 cm (5\' 7" )   Wt 67 kg (147 lb 12.8 oz)   BMI 23.15 kg/m   General: Patient is well-groomed, well-nourished, appears stated age in no acute distress   HEENT: head is atraumatic and normocephalic, trachea is midline, neck is supple with no palpable nodules   CV: Regular rhythm and normal heart rate, no murmur   Pulm: Clear to auscultation throughout  lung fields with no wheezing, crackles, or rhonchi. No increased work of breathing  Abdomen: soft , no mass, non-tender, no rebound tenderness, no hepatomegaly  Pelvic: deferred  Impression:   The primary encounter diagnosis was Pre-operative clearance. Diagnoses of Iron deficiency anemia, unspecified iron deficiency anemia type and Excessive or frequent menstruation were also pertinent to this visit.  Plan:   1. Pre-Operative Visit Patient returns for a preoperative discussion regarding her plans to proceed with surgical treatment of her menorrhagia with irregular cycle and secondary anemia by total laparoscopic hysterectomy with bilateral salpingectomy procedure.  We will perform a  cystoscopy to evaluate the urinary tract after the procedure.   Last hgb in Nov 8.6. On scheduled iv iron infusions. This is a same-day surgery but consider overnight monitoring for hgb.  The patient and I discussed the technical aspects of the procedure including the potential for risks and complications.  These include but are not limited to the risk of infection requiring post-operative antibiotics or further procedures.  We talked about the risk of injury to adjacent organs including bladder, bowel, ureter, blood vessels or nerves.  We talked about the need to convert to an open incision.  We talked about the possible need for blood transfusion.  We talked about postop complications such as thromboembolic or cardiopulmonary complications.  All of her questions were answered.  Her preoperative exam was completed and the appropriate consents were signed. She is scheduled to undergo this procedure in the near future.  Specific Peri-operative Considerations:  - Consent: obtained today - Health Maintenance: none - Labs: CBC, CMP preoperatively - Studies: EKG, CXR preoperatively - Bowel Preparation: None required - Abx:  Ancef 2g - VTE ppx: SCDs perioperatively - Glucose Protocol: none - Beta-blockade: none  - Pt scheduled for iv iron on 1/8, and she may need a blood transfusion prior to surgery as well.   Diagnoses and all orders for this visit:  Pre-operative clearance  Iron deficiency anemia, unspecified iron deficiency anemia type  Excessive or frequent menstruation

## 2019-02-14 ENCOUNTER — Other Ambulatory Visit: Payer: Self-pay

## 2019-02-14 ENCOUNTER — Inpatient Hospital Stay: Payer: BC Managed Care – PPO | Attending: Oncology

## 2019-02-14 DIAGNOSIS — N939 Abnormal uterine and vaginal bleeding, unspecified: Secondary | ICD-10-CM

## 2019-02-14 DIAGNOSIS — D500 Iron deficiency anemia secondary to blood loss (chronic): Secondary | ICD-10-CM

## 2019-02-14 DIAGNOSIS — R238 Other skin changes: Secondary | ICD-10-CM | POA: Insufficient documentation

## 2019-02-14 DIAGNOSIS — R233 Spontaneous ecchymoses: Secondary | ICD-10-CM

## 2019-02-14 LAB — IRON AND TIBC
Iron: 73 ug/dL (ref 28–170)
Saturation Ratios: 22 % (ref 10.4–31.8)
TIBC: 336 ug/dL (ref 250–450)
UIBC: 263 ug/dL

## 2019-02-14 LAB — CBC WITH DIFFERENTIAL/PLATELET
Abs Immature Granulocytes: 0.01 10*3/uL (ref 0.00–0.07)
Basophils Absolute: 0 10*3/uL (ref 0.0–0.1)
Basophils Relative: 1 %
Eosinophils Absolute: 0.1 10*3/uL (ref 0.0–0.5)
Eosinophils Relative: 1 %
HCT: 37.4 % (ref 36.0–46.0)
Hemoglobin: 11.8 g/dL — ABNORMAL LOW (ref 12.0–15.0)
Immature Granulocytes: 0 %
Lymphocytes Relative: 24 %
Lymphs Abs: 1.4 10*3/uL (ref 0.7–4.0)
MCH: 28.4 pg (ref 26.0–34.0)
MCHC: 31.6 g/dL (ref 30.0–36.0)
MCV: 89.9 fL (ref 80.0–100.0)
Monocytes Absolute: 0.4 10*3/uL (ref 0.1–1.0)
Monocytes Relative: 7 %
Neutro Abs: 3.9 10*3/uL (ref 1.7–7.7)
Neutrophils Relative %: 67 %
Platelets: 234 10*3/uL (ref 150–400)
RBC: 4.16 MIL/uL (ref 3.87–5.11)
RDW: 18.8 % — ABNORMAL HIGH (ref 11.5–15.5)
WBC: 5.8 10*3/uL (ref 4.0–10.5)
nRBC: 0 % (ref 0.0–0.2)

## 2019-02-14 LAB — FERRITIN: Ferritin: 27 ng/mL (ref 11–307)

## 2019-02-16 ENCOUNTER — Inpatient Hospital Stay (HOSPITAL_BASED_OUTPATIENT_CLINIC_OR_DEPARTMENT_OTHER): Payer: BC Managed Care – PPO | Admitting: Oncology

## 2019-02-16 ENCOUNTER — Inpatient Hospital Stay: Payer: BC Managed Care – PPO

## 2019-02-16 ENCOUNTER — Encounter: Payer: Self-pay | Admitting: Oncology

## 2019-02-16 ENCOUNTER — Other Ambulatory Visit: Payer: Self-pay

## 2019-02-16 ENCOUNTER — Encounter
Admission: RE | Admit: 2019-02-16 | Discharge: 2019-02-16 | Disposition: A | Discharge status: Home / Self Care | Payer: BC Managed Care – PPO | Source: Ambulatory Visit | Attending: Obstetrics and Gynecology | Admitting: Obstetrics and Gynecology

## 2019-02-16 VITALS — BP 120/80 | HR 82 | Temp 97.9°F | Resp 18 | Wt 148.8 lb

## 2019-02-16 VITALS — BP 107/69 | HR 74 | Resp 18

## 2019-02-16 DIAGNOSIS — D500 Iron deficiency anemia secondary to blood loss (chronic): Secondary | ICD-10-CM | POA: Diagnosis not present

## 2019-02-16 HISTORY — DX: Other complications of anesthesia, initial encounter: T88.59XA

## 2019-02-16 HISTORY — DX: Anemia, unspecified: D64.9

## 2019-02-16 MED ORDER — IRON SUCROSE 20 MG/ML IV SOLN
200.0000 mg | Freq: Once | INTRAVENOUS | Status: AC
  Administered 2019-02-16: 200 mg via INTRAVENOUS
  Filled 2019-02-16: qty 10

## 2019-02-16 MED ORDER — SODIUM CHLORIDE 0.9 % IV SOLN
INTRAVENOUS | Status: DC
  Filled 2019-02-16: qty 250

## 2019-02-16 NOTE — Patient Instructions (Signed)
Your procedure is scheduled on: 02/26/19 Report to DAY SURGERY DEPARTMENT LOCATED ON 2ND FLOOR MEDICAL MALL ENTRANCE. To find out your arrival time please call [phone] between 1PM - 3PM on 02/23/19.  Remember: Instructions that are not followed completely may result in serious medical risk, up to and including death, or upon the discretion of your surgeon and anesthesiologist your surgery may need to be rescheduled.     _X__ 1. Do not eat food after midnight the night before your procedure.                 No gum chewing or hard candies. You may drink clear liquids up to 2 hours                 before you are scheduled to arrive for your surgery- DO not drink clear                 liquids within 2 hours of the start of your surgery.                 Clear Liquids include:  water, apple juice without pulp, clear carbohydrate                 drink such as Clearfast or Gatorade, Black Coffee or Tea (Do not add                 anything to coffee or tea). Diabetics water only  __X__2.  On the morning of surgery brush your teeth with toothpaste and water, you                 may rinse your mouth with mouthwash if you wish.  Do not swallow any              toothpaste of mouthwash.     _X__ 3.  No Alcohol for 24 hours before or after surgery.   _X__ 4.  Do Not Smoke or use e-cigarettes For 24 Hours Prior to Your Surgery.                 Do not use any chewable tobacco products for at least 6 hours prior to                 surgery.  ____  5.  Bring all medications with you on the day of surgery if instructed.   __X__  6.  Notify your doctor if there is any change in your medical condition      (cold, fever, infections).     Do not wear jewelry, make-up, hairpins, clips or nail polish. Do not wear lotions, powders, or perfumes.  Do not shave 48 hours prior to surgery. Men may shave face and neck. Do not bring valuables to the hospital.    [facility] is not responsible for any belongings or  valuables.  Contacts, dentures/partials or body piercings may not be worn into surgery. Bring a case for your contacts, glasses or hearing aids, a denture cup will be supplied. Leave your suitcase in the car. After surgery it may be brought to your room. For patients admitted to the hospital, discharge time is determined by your treatment team.   Patients discharged the day of surgery will not be allowed to drive home.   Please read over the following fact sheets that you were given:   MRSA Information  __X__ Take these medicines the morning of surgery with A SIP OF WATER:  1. Bupropion  2. Clonazepam  3.   4.  5.  6.  ____ Fleet Enema (as directed)   __X__ Use CHG Soap/SAGE wipes as directed  ____ Use inhalers on the day of surgery  ____ Stop metformin/Janumet/Farxiga 2 days prior to surgery    ____ Take 1/2 of usual insulin dose the night before surgery. No insulin the morning          of surgery.   ____ Stop Blood Thinners Coumadin/Plavix/Xarelto/Pleta/Pradaxa/Eliquis/Effient/Aspirin  on   Or contact your Surgeon, Cardiologist or Medical Doctor regarding  ability to stop your blood thinners  __X__ Stop Anti-inflammatories 7 days before surgery such as Advil, Ibuprofen, Motrin,  BC or Goodies Powder, Naprosyn, Naproxen, Aleve, Aspirin    __X__ Stop all herbal supplements, fish oil or vitamin E until 7 days before surgery. Melatonin  ____ Bring C-Pap to the hospital.     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

## 2019-02-16 NOTE — Progress Notes (Signed)
Patient here for follow up possible iron infusion. She reports tiredness decreased appetite . Pt will have hysterectomy on 1/18.

## 2019-02-17 ENCOUNTER — Encounter: Payer: Self-pay | Admitting: Oncology

## 2019-02-17 NOTE — Progress Notes (Signed)
Hematology/Oncology Consult note [facility] Telephone:([phone] [phone]   Patient Care Team: Joycelyn Rua, MD as PCP - General (Family Medicine)  REFERRING PROVIDER: Joycelyn Rua, MD CHIEF COMPLAINTS/REASON FOR VISIT:  Evaluation of iron deficiency anemia  INTERVAL HISTORY Chelsea Carter is a 44 y.o. female who has above history reviewed by me today presents for follow up visit for management of iron deficiency anemia.  Problems and complaints are listed below: Iron deficiency anemia is considered to be secondary to heavy menstrual bleeding. Patient also reported easy bruising.  Her platelet count has been normal and von Willebrand disease screening is negative. Patient received IV Venofer since last visit.  She reports that her fatigue has improved. She states Dr. Dalbert Garnet and has an elective hysterectomy procedure plan on 02/26/2019. Dr. Dalbert Garnet communicated with me and patient is follow-up appointment was moved up to today for reassessment.  Review of Systems  Constitutional: Negative for appetite change, chills, fatigue and fever.  HENT:   Negative for hearing loss and voice change.   Eyes: Negative for eye problems.  Respiratory: Negative for chest tightness and cough.   Cardiovascular: Negative for chest pain.  Gastrointestinal: Negative for abdominal distention, abdominal pain and blood in stool.  Endocrine: Negative for hot flashes.  Genitourinary: Negative for difficulty urinating and frequency.   Musculoskeletal: Negative for arthralgias.  Skin: Negative for itching and rash.  Neurological: Negative for extremity weakness.  Hematological: Negative for adenopathy.  Psychiatric/Behavioral: Negative for confusion.    MEDICAL HISTORY:  Past Medical History:  Diagnosis Date  . Anemia   . Anxiety   . Complication of anesthesia    did not get complete effects from spinal  . Depression   . Insomnia     SURGICAL  HISTORY: Past Surgical History:  Procedure Laterality Date  . BREAST CYST ASPIRATION Right 08/27/2015   neg  . BREAST CYST EXCISION Right 02/04/2012   removal of fibroadenoma  . heel spurs    . NASAL SINUS SURGERY    . TUBAL LIGATION      SOCIAL HISTORY: Social History   Socioeconomic History  . Marital status: Married    Spouse name: Not on file  . Number of children: Not on file  . Years of education: Not on file  . Highest education level: Not on file  Occupational History  . Not on file  Tobacco Use  . Smoking status: Never Smoker  . Smokeless tobacco: Never Used  Substance and Sexual Activity  . Alcohol use: Never  . Drug use: Never  . Sexual activity: Yes  Other Topics Concern  . Not on file  Social History Narrative  . Not on file   Social Determinants of Health   Financial Resource Strain:   . Difficulty of Paying Living Expenses: Not on file  Food Insecurity:   . Worried About Programme researcher, broadcasting/film/video in the Last Year: Not on file  . Ran Out of Food in the Last Year: Not on file  Transportation Needs:   . Lack of Transportation (Medical): Not on file  . Lack of Transportation (Non-Medical): Not on file  Physical Activity:   . Days of Exercise per Week: Not on file  . Minutes of Exercise per Session: Not on file  Stress:   . Feeling of Stress : Not on file  Social Connections:   . Frequency of Communication with Friends and Family: Not on file  . Frequency of Social Gatherings with Friends and Family:  Not on file  . Attends Religious Services: Not on file  . Active Member of Clubs or Organizations: Not on file  . Attends Banker Meetings: Not on file  . Marital Status: Not on file  Intimate Partner Violence:   . Fear of Current or Ex-Partner: Not on file  . Emotionally Abused: Not on file  . Physically Abused: Not on file  . Sexually Abused: Not on file    FAMILY HISTORY: Family History  Problem Relation Age of Onset  . Brain cancer  Maternal Uncle   . Breast cancer Neg Hx     ALLERGIES:  is allergic to ondansetron hcl and zolpidem.  MEDICATIONS:  Current Outpatient Medications  Medication Sig Dispense Refill  . bisacodyl (DULCOLAX) 5 MG EC tablet Take 10 mg by mouth daily.    Marland Kitchen buPROPion (WELLBUTRIN XL) 300 MG 24 hr tablet Take 300 mg by mouth 2 (two) times daily.     . clonazePAM (KLONOPIN) 0.5 MG tablet Take 0.5 mg by mouth 2 (two) times daily.     Marland Kitchen escitalopram (LEXAPRO) 20 MG tablet Take 20 mg by mouth every evening.     . Melatonin 5 MG TABS Take 5 mg by mouth at bedtime as needed (sleep).    . temazepam (RESTORIL) 15 MG capsule 15 mg at bedtime.     . ferrous sulfate 325 (65 FE) MG EC tablet Take by mouth.    Marland Kitchen ibuprofen (ADVIL) 200 MG tablet Take 400 mg by mouth every 6 (six) hours as needed for headache or moderate pain.     No current facility-administered medications for this visit.     PHYSICAL EXAMINATION: ECOG PERFORMANCE STATUS: 0 - Asymptomatic Vitals:   02/16/19 1314  BP: 120/80  Pulse: 82  Resp: 18  Temp: 97.9 F (36.6 C)   Filed Weights   02/16/19 1314  Weight: 148 lb 12.8 oz (67.5 kg)    Physical Exam Constitutional:      General: She is not in acute distress. HENT:     Head: Normocephalic and atraumatic.  Eyes:     General: No scleral icterus.    Pupils: Pupils are equal, round, and reactive to light.  Cardiovascular:     Rate and Rhythm: Normal rate and regular rhythm.     Heart sounds: Normal heart sounds.  Pulmonary:     Effort: Pulmonary effort is normal. No respiratory distress.     Breath sounds: No wheezing.  Abdominal:     General: Bowel sounds are normal. There is no distension.     Palpations: Abdomen is soft. There is no mass.     Tenderness: There is no abdominal tenderness.  Musculoskeletal:        General: No deformity. Normal range of motion.     Cervical back: Normal range of motion and neck supple.  Skin:    General: Skin is warm and dry.      Coloration: Skin is not pale.     Findings: No erythema or rash.  Neurological:     Mental Status: She is alert and oriented to person, place, and time.     Cranial Nerves: No cranial nerve deficit.     Coordination: Coordination normal.  Psychiatric:        Behavior: Behavior normal.        Thought Content: Thought content normal.       CMP Latest Ref Rng & Units 12/05/2018  Glucose 70 - 99 mg/dL 322(G)  BUN  6 - 20 mg/dL 6  Creatinine 0.44 - 1.00 mg/dL 0.78  Sodium 135 - 145 mmol/L 138  Potassium 3.5 - 5.1 mmol/L 3.6  Chloride 98 - 111 mmol/L 105  CO2 22 - 32 mmol/L 26  Calcium 8.9 - 10.3 mg/dL 8.9  Total Protein 6.5 - 8.1 g/dL 7.8  Total Bilirubin 0.3 - 1.2 mg/dL 0.3  Alkaline Phos 38 - 126 U/L 62  AST 15 - 41 U/L 15  ALT 0 - 44 U/L 15   CBC Latest Ref Rng & Units 02/14/2019  WBC 4.0 - 10.5 K/uL 5.8  Hemoglobin 12.0 - 15.0 g/dL 11.8(L)  Hematocrit 36.0 - 46.0 % 37.4  Platelets 150 - 400 K/uL 234     LABORATORY DATA:  I have reviewed the data as listed Lab Results  Component Value Date   WBC 5.8 02/14/2019   HGB 11.8 (L) 02/14/2019   HCT 37.4 02/14/2019   MCV 89.9 02/14/2019   PLT 234 02/14/2019   Recent Labs    12/05/18 1548  NA 138  K 3.6  CL 105  CO2 26  GLUCOSE 103*  BUN 6  CREATININE 0.78  CALCIUM 8.9  GFRNONAA >60  GFRAA >60  PROT 7.8  ALBUMIN 4.2  AST 15  ALT 15  ALKPHOS 62  BILITOT 0.3   Iron/TIBC/Ferritin/ %Sat    Component Value Date/Time   IRON 73 02/14/2019 1204   TIBC 336 02/14/2019 1204   FERRITIN 27 02/14/2019 1204   IRONPCTSAT 22 02/14/2019 1204     No results found.    ASSESSMENT & PLAN:  1. Iron deficiency anemia due to chronic blood loss    #Iron deficiency anemia Labs reviewed and discussed with patient. Hemoglobin has improved to 11.8, iron panel has improved with ferritin of 27, iron saturation 22. Patient says that she is about to have another menstrual cycle starting, and also given that she is going to have  elective hysterectomy and anticipate to have some blood loss, I recommend to proceed with 1 additional dose of IV Venofer 200 mg today.  Hopefully this will prepare her with additional iron store for upcoming procedures. Patient agrees with plan.  Easy bruising, normal TSH, von Willebrand panel normal.  Normal PT and PTT.  Normal liver function as well. Patient has had surgeries before without severe bleeding complications.  I will hold additional work-up for now.   Orders Placed This Encounter  Procedures  . CBC with Differential    Standing Status:   Future    Standing Expiration Date:   02/16/2020  . Iron and TIBC    Standing Status:   Future    Standing Expiration Date:   02/16/2020  . Ferritin    Standing Status:   Future    Standing Expiration Date:   02/16/2020    All questions were answered. The patient knows to call the clinic with any problems questions or concerns.  Cc Orpah Melter, MD  Return of visit: 6 months for reassessment.  Earlie Server, MD, PhD Hematology Oncology [facility] at [facility] Pager- [phone] 02/17/2019

## 2019-02-22 ENCOUNTER — Other Ambulatory Visit
Admission: RE | Admit: 2019-02-22 | Discharge: 2019-02-22 | Disposition: A | Discharge status: Home / Self Care | Payer: BC Managed Care – PPO | Source: Ambulatory Visit | Attending: Obstetrics and Gynecology | Admitting: Obstetrics and Gynecology

## 2019-02-22 ENCOUNTER — Other Ambulatory Visit: Payer: Self-pay

## 2019-02-22 DIAGNOSIS — Z20822 Contact with and (suspected) exposure to covid-19: Secondary | ICD-10-CM | POA: Diagnosis not present

## 2019-02-22 DIAGNOSIS — Z01812 Encounter for preprocedural laboratory examination: Secondary | ICD-10-CM | POA: Insufficient documentation

## 2019-02-22 LAB — CBC
HCT: 35.3 % — ABNORMAL LOW (ref 36.0–46.0)
Hemoglobin: 11.1 g/dL — ABNORMAL LOW (ref 12.0–15.0)
MCH: 28.2 pg (ref 26.0–34.0)
MCHC: 31.4 g/dL (ref 30.0–36.0)
MCV: 89.8 fL (ref 80.0–100.0)
Platelets: 253 10*3/uL (ref 150–400)
RBC: 3.93 MIL/uL (ref 3.87–5.11)
RDW: 17.1 % — ABNORMAL HIGH (ref 11.5–15.5)
WBC: 5.3 10*3/uL (ref 4.0–10.5)
nRBC: 0 % (ref 0.0–0.2)

## 2019-02-22 LAB — TYPE AND SCREEN
ABO/RH(D): O POS
Antibody Screen: NEGATIVE

## 2019-02-22 LAB — BASIC METABOLIC PANEL
Anion gap: 7 (ref 5–15)
BUN: 7 mg/dL (ref 6–20)
CO2: 26 mmol/L (ref 22–32)
Calcium: 8.8 mg/dL — ABNORMAL LOW (ref 8.9–10.3)
Chloride: 107 mmol/L (ref 98–111)
Creatinine, Ser: 1 mg/dL (ref 0.44–1.00)
GFR calc Af Amer: >60 mL/min (ref >60)
GFR calc non Af Amer: >60 mL/min (ref >60)
Glucose, Bld: 120 mg/dL — ABNORMAL HIGH (ref 70–99)
Potassium: 3.6 mmol/L (ref 3.5–5.1)
Sodium: 140 mmol/L (ref 135–145)

## 2019-02-22 LAB — SARS CORONAVIRUS 2 (TAT 6-24 HRS): SARS Coronavirus 2: NEGATIVE

## 2019-02-23 ENCOUNTER — Other Ambulatory Visit: Payer: Self-pay | Admitting: Obstetrics and Gynecology

## 2019-02-26 ENCOUNTER — Encounter
Admission: RE | Disposition: A | Discharge status: Home / Self Care | Payer: Self-pay | Source: Home / Self Care | Attending: Obstetrics and Gynecology

## 2019-02-26 ENCOUNTER — Observation Stay
Admission: RE | Admit: 2019-02-26 | Discharge: 2019-02-27 | Disposition: A | Discharge status: Home / Self Care | Payer: BC Managed Care – PPO | Attending: Obstetrics and Gynecology | Admitting: Obstetrics and Gynecology

## 2019-02-26 ENCOUNTER — Ambulatory Visit: Payer: BC Managed Care – PPO | Admitting: Certified Registered"

## 2019-02-26 ENCOUNTER — Other Ambulatory Visit: Payer: Self-pay

## 2019-02-26 ENCOUNTER — Encounter: Payer: Self-pay | Admitting: Obstetrics and Gynecology

## 2019-02-26 DIAGNOSIS — F329 Major depressive disorder, single episode, unspecified: Secondary | ICD-10-CM | POA: Diagnosis not present

## 2019-02-26 DIAGNOSIS — N921 Excessive and frequent menstruation with irregular cycle: Secondary | ICD-10-CM | POA: Diagnosis present

## 2019-02-26 DIAGNOSIS — Z79899 Other long term (current) drug therapy: Secondary | ICD-10-CM | POA: Diagnosis not present

## 2019-02-26 DIAGNOSIS — F419 Anxiety disorder, unspecified: Secondary | ICD-10-CM | POA: Diagnosis not present

## 2019-02-26 DIAGNOSIS — N920 Excessive and frequent menstruation with regular cycle: Principal | ICD-10-CM | POA: Insufficient documentation

## 2019-02-26 DIAGNOSIS — Z888 Allergy status to other drugs, medicaments and biological substances status: Secondary | ICD-10-CM | POA: Diagnosis not present

## 2019-02-26 DIAGNOSIS — D509 Iron deficiency anemia, unspecified: Secondary | ICD-10-CM | POA: Insufficient documentation

## 2019-02-26 DIAGNOSIS — N841 Polyp of cervix uteri: Secondary | ICD-10-CM | POA: Diagnosis not present

## 2019-02-26 DIAGNOSIS — G4700 Insomnia, unspecified: Secondary | ICD-10-CM | POA: Diagnosis not present

## 2019-02-26 HISTORY — PX: LAPAROSCOPIC HYSTERECTOMY: SHX1926

## 2019-02-26 HISTORY — PX: LAPAROSCOPIC BILATERAL SALPINGECTOMY: SHX5889

## 2019-02-26 LAB — ABO/RH: ABO/RH(D): O POS

## 2019-02-26 LAB — POCT URINE PREGNANCY: Preg Test, Ur: NEGATIVE

## 2019-02-26 SURGERY — HYSTERECTOMY, TOTAL, LAPAROSCOPIC
Anesthesia: General

## 2019-02-26 MED ORDER — OXYCODONE HCL 5 MG PO TABS
5.0000 mg | ORAL_TABLET | Freq: Four times a day (QID) | ORAL | Status: DC | PRN
  Administered 2019-02-26 – 2019-02-27 (×3): 5 mg via ORAL
  Filled 2019-02-26 (×3): qty 1

## 2019-02-26 MED ORDER — GABAPENTIN 300 MG PO CAPS
ORAL_CAPSULE | ORAL | Status: AC
  Administered 2019-02-26: 300 mg via ORAL
  Filled 2019-02-26: qty 1

## 2019-02-26 MED ORDER — FENTANYL CITRATE (PF) 100 MCG/2ML IJ SOLN
INTRAMUSCULAR | Status: AC
  Administered 2019-02-26: 25 ug via INTRAVENOUS
  Filled 2019-02-26: qty 2

## 2019-02-26 MED ORDER — FERROUS SULFATE 325 (65 FE) MG PO TABS
325.0000 mg | ORAL_TABLET | Freq: Every day | ORAL | Status: DC
  Administered 2019-02-27: 325 mg via ORAL
  Filled 2019-02-26: qty 1

## 2019-02-26 MED ORDER — FENTANYL CITRATE (PF) 100 MCG/2ML IJ SOLN
25.0000 ug | INTRAMUSCULAR | Status: AC | PRN
  Administered 2019-02-26 (×4): 25 ug via INTRAVENOUS

## 2019-02-26 MED ORDER — MENTHOL 3 MG MT LOZG
1.0000 | LOZENGE | OROMUCOSAL | Status: DC | PRN
  Filled 2019-02-26: qty 9

## 2019-02-26 MED ORDER — METOCLOPRAMIDE HCL 5 MG/ML IJ SOLN: 10.0000 mg | Freq: Four times a day (QID) | INTRAMUSCULAR | Status: DC | PRN

## 2019-02-26 MED ORDER — PROPOFOL 10 MG/ML IV BOLUS
INTRAVENOUS | Status: AC
  Filled 2019-02-26: qty 20

## 2019-02-26 MED ORDER — IBUPROFEN 800 MG PO TABS: 400.0000 mg | ORAL_TABLET | Freq: Four times a day (QID) | ORAL | Status: DC | PRN

## 2019-02-26 MED ORDER — GABAPENTIN 300 MG PO CAPS: 300.0000 mg | ORAL_CAPSULE | ORAL | Status: AC

## 2019-02-26 MED ORDER — ROCURONIUM BROMIDE 50 MG/5ML IV SOLN
INTRAVENOUS | Status: AC
  Filled 2019-02-26: qty 1

## 2019-02-26 MED ORDER — CELECOXIB 200 MG PO CAPS
ORAL_CAPSULE | ORAL | Status: AC
  Administered 2019-02-26: 400 mg via ORAL
  Filled 2019-02-26: qty 2

## 2019-02-26 MED ORDER — MEPERIDINE HCL 50 MG/ML IJ SOLN: 6.2500 mg | INTRAMUSCULAR | Status: DC | PRN

## 2019-02-26 MED ORDER — IBUPROFEN 800 MG PO TABS
800.0000 mg | ORAL_TABLET | Freq: Three times a day (TID) | ORAL | Status: DC
  Administered 2019-02-26 – 2019-02-27 (×2): 800 mg via ORAL
  Filled 2019-02-26 (×2): qty 1

## 2019-02-26 MED ORDER — SIMETHICONE 80 MG PO CHEW
80.0000 mg | CHEWABLE_TABLET | Freq: Four times a day (QID) | ORAL | Status: DC | PRN
  Administered 2019-02-26 – 2019-02-27 (×2): 80 mg via ORAL
  Filled 2019-02-26: qty 1

## 2019-02-26 MED ORDER — SUGAMMADEX SODIUM 200 MG/2ML IV SOLN
INTRAVENOUS | Status: DC | PRN
  Administered 2019-02-26: 200 mg via INTRAVENOUS

## 2019-02-26 MED ORDER — GABAPENTIN 300 MG PO CAPS
400.0000 mg | ORAL_CAPSULE | Freq: Every day | ORAL | Status: DC
  Administered 2019-02-26: 400 mg via ORAL
  Filled 2019-02-26: qty 1

## 2019-02-26 MED ORDER — FAMOTIDINE 20 MG PO TABS
ORAL_TABLET | ORAL | Status: AC
  Administered 2019-02-26: 20 mg via ORAL
  Filled 2019-02-26: qty 1

## 2019-02-26 MED ORDER — ACETAMINOPHEN 500 MG PO TABS: 1000.0000 mg | ORAL_TABLET | ORAL | Status: AC

## 2019-02-26 MED ORDER — PROPOFOL 10 MG/ML IV BOLUS
INTRAVENOUS | Status: DC | PRN
  Administered 2019-02-26: 150 mg via INTRAVENOUS

## 2019-02-26 MED ORDER — FENTANYL CITRATE (PF) 100 MCG/2ML IJ SOLN
INTRAMUSCULAR | Status: DC | PRN
  Administered 2019-02-26: 100 ug via INTRAVENOUS

## 2019-02-26 MED ORDER — ESCITALOPRAM OXALATE 10 MG PO TABS
20.0000 mg | ORAL_TABLET | Freq: Every evening | ORAL | Status: DC
  Administered 2019-02-26: 20 mg via ORAL
  Filled 2019-02-26: qty 2

## 2019-02-26 MED ORDER — FENTANYL CITRATE (PF) 250 MCG/5ML IJ SOLN
INTRAMUSCULAR | Status: AC
  Filled 2019-02-26: qty 5

## 2019-02-26 MED ORDER — TEMAZEPAM 15 MG PO CAPS
15.0000 mg | ORAL_CAPSULE | Freq: Every day | ORAL | Status: DC
  Administered 2019-02-26: 15 mg via ORAL
  Filled 2019-02-26: qty 1

## 2019-02-26 MED ORDER — CEFAZOLIN SODIUM-DEXTROSE 2-4 GM/100ML-% IV SOLN
2.0000 g | INTRAVENOUS | Status: AC
  Administered 2019-02-26: 12:00:00 2 g via INTRAVENOUS

## 2019-02-26 MED ORDER — DEXAMETHASONE SODIUM PHOSPHATE 10 MG/ML IJ SOLN
INTRAMUSCULAR | Status: DC | PRN
  Administered 2019-02-26: 10 mg via INTRAVENOUS

## 2019-02-26 MED ORDER — BUPIVACAINE HCL 0.5 % IJ SOLN
INTRAMUSCULAR | Status: DC | PRN
  Administered 2019-02-26: 5 mL

## 2019-02-26 MED ORDER — CEFAZOLIN SODIUM-DEXTROSE 2-4 GM/100ML-% IV SOLN
INTRAVENOUS | Status: AC
  Filled 2019-02-26: qty 100

## 2019-02-26 MED ORDER — FAMOTIDINE 20 MG PO TABS: 20.0000 mg | ORAL_TABLET | Freq: Once | ORAL | Status: AC

## 2019-02-26 MED ORDER — LACTATED RINGERS IV SOLN: INTRAVENOUS | Status: DC

## 2019-02-26 MED ORDER — BISACODYL 5 MG PO TBEC
10.0000 mg | DELAYED_RELEASE_TABLET | Freq: Every day | ORAL | Status: DC
  Administered 2019-02-27: 10 mg via ORAL
  Filled 2019-02-26: qty 2

## 2019-02-26 MED ORDER — CLONAZEPAM 0.5 MG PO TABS
0.5000 mg | ORAL_TABLET | Freq: Two times a day (BID) | ORAL | Status: DC | PRN
  Administered 2019-02-26: 0.5 mg via ORAL
  Filled 2019-02-26: qty 1

## 2019-02-26 MED ORDER — OXYCODONE HCL 5 MG/5ML PO SOLN: 5.0000 mg | Freq: Once | ORAL | Status: DC | PRN

## 2019-02-26 MED ORDER — ALUM & MAG HYDROXIDE-SIMETH 200-200-20 MG/5ML PO SUSP: 30.0000 mL | ORAL | Status: DC | PRN

## 2019-02-26 MED ORDER — MIDAZOLAM HCL 2 MG/2ML IJ SOLN
INTRAMUSCULAR | Status: AC
  Filled 2019-02-26: qty 2

## 2019-02-26 MED ORDER — OXYCODONE HCL 5 MG PO TABS: 5.0000 mg | ORAL_TABLET | Freq: Once | ORAL | Status: DC | PRN

## 2019-02-26 MED ORDER — LIDOCAINE HCL (CARDIAC) PF 100 MG/5ML IV SOSY
PREFILLED_SYRINGE | INTRAVENOUS | Status: DC | PRN
  Administered 2019-02-26: 80 mg via INTRAVENOUS

## 2019-02-26 MED ORDER — BUPIVACAINE HCL (PF) 0.5 % IJ SOLN
INTRAMUSCULAR | Status: AC
  Filled 2019-02-26: qty 30

## 2019-02-26 MED ORDER — ROCURONIUM BROMIDE 100 MG/10ML IV SOLN
INTRAVENOUS | Status: DC | PRN
  Administered 2019-02-26: 50 mg via INTRAVENOUS
  Administered 2019-02-26: 20 mg via INTRAVENOUS

## 2019-02-26 MED ORDER — CELECOXIB 200 MG PO CAPS: 400.0000 mg | ORAL_CAPSULE | ORAL | Status: AC

## 2019-02-26 MED ORDER — MELATONIN 5 MG PO TABS
5.0000 mg | ORAL_TABLET | Freq: Every evening | ORAL | Status: DC | PRN
  Filled 2019-02-26: qty 1

## 2019-02-26 MED ORDER — ACETAMINOPHEN 500 MG PO TABS
ORAL_TABLET | ORAL | Status: AC
  Administered 2019-02-26: 1000 mg via ORAL
  Filled 2019-02-26: qty 2

## 2019-02-26 MED ORDER — PROMETHAZINE HCL 25 MG/ML IJ SOLN: 6.2500 mg | INTRAMUSCULAR | Status: DC | PRN

## 2019-02-26 MED ORDER — ACETAMINOPHEN 500 MG PO TABS
1000.0000 mg | ORAL_TABLET | Freq: Four times a day (QID) | ORAL | Status: DC
  Administered 2019-02-26 – 2019-02-27 (×3): 1000 mg via ORAL
  Filled 2019-02-26 (×3): qty 2

## 2019-02-26 MED ORDER — DOCUSATE SODIUM 100 MG PO CAPS
100.0000 mg | ORAL_CAPSULE | Freq: Two times a day (BID) | ORAL | Status: DC
  Administered 2019-02-26 – 2019-02-27 (×2): 100 mg via ORAL
  Filled 2019-02-26 (×2): qty 1

## 2019-02-26 MED ORDER — BUPROPION HCL ER (XL) 300 MG PO TB24
300.0000 mg | ORAL_TABLET | Freq: Every day | ORAL | Status: DC
  Administered 2019-02-27: 300 mg via ORAL
  Filled 2019-02-26: qty 1

## 2019-02-26 MED ORDER — KETOROLAC TROMETHAMINE 30 MG/ML IJ SOLN
30.0000 mg | Freq: Once | INTRAMUSCULAR | Status: AC
  Administered 2019-02-26: 30 mg via INTRAVENOUS
  Filled 2019-02-26: qty 1

## 2019-02-26 SURGICAL SUPPLY — 68 items
BAG URINE DRAIN 2000ML AR STRL (UROLOGICAL SUPPLIES) ×8 IMPLANT
BLADE SURG SZ11 CARB STEEL (BLADE) ×4 IMPLANT
CATH FOLEY 2WAY  5CC 16FR (CATHETERS) ×2
CATH URTH 16FR FL 2W BLN LF (CATHETERS) ×2 IMPLANT
CHLORAPREP W/TINT 26 (MISCELLANEOUS) ×4 IMPLANT
CLOSURE WOUND 1/4X4 (GAUZE/BANDAGES/DRESSINGS) ×1
CORD MONOPOLAR M/FML 12FT (MISCELLANEOUS) ×4 IMPLANT
COUNTER NEEDLE 20/40 LG (NEEDLE) ×4 IMPLANT
COVER LIGHT HANDLE STERIS (MISCELLANEOUS) ×8 IMPLANT
COVER WAND RF STERILE (DRAPES) ×4 IMPLANT
DERMABOND ADVANCED (GAUZE/BANDAGES/DRESSINGS) ×2
DERMABOND ADVANCED .7 DNX12 (GAUZE/BANDAGES/DRESSINGS) ×2 IMPLANT
DEVICE SUTURE ENDOST 10MM (ENDOMECHANICALS) ×4 IMPLANT
DRAPE GENERAL ENDO 106X123.5 (DRAPES) ×4 IMPLANT
DRAPE LEGGINS SURG 28X43 STRL (DRAPES) ×4 IMPLANT
DRAPE STERI POUCH LG 24X46 STR (DRAPES) ×4 IMPLANT
DRAPE UNDER BUTTOCK W/FLU (DRAPES) ×4 IMPLANT
DRSG TEGADERM 2-3/8X2-3/4 SM (GAUZE/BANDAGES/DRESSINGS) ×12 IMPLANT
GLOVE BIO SURGEON STRL SZ7 (GLOVE) ×12 IMPLANT
GLOVE INDICATOR 7.5 STRL GRN (GLOVE) ×4 IMPLANT
GOWN STRL REUS W/ TWL LRG LVL3 (GOWN DISPOSABLE) ×4 IMPLANT
GOWN STRL REUS W/ TWL XL LVL3 (GOWN DISPOSABLE) ×2 IMPLANT
GOWN STRL REUS W/TWL LRG LVL3 (GOWN DISPOSABLE) ×4
GOWN STRL REUS W/TWL XL LVL3 (GOWN DISPOSABLE) ×2
GRASPER SUT TROCAR 14GX15 (MISCELLANEOUS) ×4 IMPLANT
IRRIGATION STRYKERFLOW (MISCELLANEOUS) ×2 IMPLANT
IRRIGATOR STRYKERFLOW (MISCELLANEOUS) ×4
IV NS 1000ML (IV SOLUTION) ×2
IV NS 1000ML BAXH (IV SOLUTION) ×2 IMPLANT
KIT PINK PAD W/HEAD ARE REST (MISCELLANEOUS) ×4
KIT PINK PAD W/HEAD ARM REST (MISCELLANEOUS) ×2 IMPLANT
KIT TURNOVER CYSTO (KITS) ×4 IMPLANT
LABEL OR SOLS (LABEL) ×4 IMPLANT
LIGASURE VESSEL 5MM BLUNT TIP (ELECTROSURGICAL) ×4 IMPLANT
MANIPULATOR VCARE LG CRV RETR (MISCELLANEOUS) IMPLANT
MANIPULATOR VCARE SML CRV RETR (MISCELLANEOUS) IMPLANT
MANIPULATOR VCARE STD CRV RETR (MISCELLANEOUS) ×4 IMPLANT
NEEDLE FILTER BLUNT 18X 1/2SAF (NEEDLE) ×2
NEEDLE FILTER BLUNT 18X1 1/2 (NEEDLE) ×2 IMPLANT
NS IRRIG 500ML POUR BTL (IV SOLUTION) ×4 IMPLANT
OCCLUDER COLPOPNEUMO (BALLOONS) ×4 IMPLANT
PACK GYN LAPAROSCOPIC (MISCELLANEOUS) ×4 IMPLANT
PAD OB MATERNITY 4.3X12.25 (PERSONAL CARE ITEMS) ×4 IMPLANT
PAD PREP 24X41 OB/GYN DISP (PERSONAL CARE ITEMS) ×4 IMPLANT
POUCH SPECIMEN RETRIEVAL 10MM (ENDOMECHANICALS) IMPLANT
SCISSORS METZENBAUM CVD 33 (INSTRUMENTS) ×4 IMPLANT
SET CYSTO W/LG BORE CLAMP LF (SET/KITS/TRAYS/PACK) IMPLANT
SET TUBE SMOKE EVAC HIGH FLOW (TUBING) ×4 IMPLANT
SLEEVE ENDOPATH XCEL 5M (ENDOMECHANICALS) ×4 IMPLANT
SPONGE GAUZE 2X2 8PLY STER LF (GAUZE/BANDAGES/DRESSINGS) ×2
SPONGE GAUZE 2X2 8PLY STRL LF (GAUZE/BANDAGES/DRESSINGS) ×6 IMPLANT
STRIP CLOSURE SKIN 1/4X4 (GAUZE/BANDAGES/DRESSINGS) ×3 IMPLANT
SUT ENDO VLOC 180-0-8IN (SUTURE) ×4 IMPLANT
SUT MNCRL 4-0 (SUTURE) ×2
SUT MNCRL 4-0 27XMFL (SUTURE) ×2
SUT MNCRL AB 4-0 PS2 18 (SUTURE) ×4 IMPLANT
SUT VIC AB 0 CT1 36 (SUTURE) ×8 IMPLANT
SUT VIC AB 2-0 UR6 27 (SUTURE) ×4 IMPLANT
SUT VIC AB 4-0 SH 27 (SUTURE) ×2
SUT VIC AB 4-0 SH 27XANBCTRL (SUTURE) ×2 IMPLANT
SUTURE MNCRL 4-0 27XMF (SUTURE) ×2 IMPLANT
SYR 10ML LL (SYRINGE) ×4 IMPLANT
SYR 50ML LL SCALE MARK (SYRINGE) ×4 IMPLANT
SYR 5ML LL (SYRINGE) ×4 IMPLANT
TROCAR ENDO BLADELESS 11MM (ENDOMECHANICALS) IMPLANT
TROCAR XCEL NON-BLD 5MMX100MML (ENDOMECHANICALS) ×4 IMPLANT
TUBING ART PRESS 48 MALE/FEM (TUBING) IMPLANT
TUBING EVAC SMOKE HEATED PNEUM (TUBING) ×4 IMPLANT

## 2019-02-26 NOTE — Transfer of Care (Signed)
Immediate Anesthesia Transfer of Care Note  Patient: Chelsea Carter  Procedure(s) Performed: HYSTERECTOMY TOTAL LAPAROSCOPIC (N/A ) LAPAROSCOPIC BILATERAL SALPINGECTOMY (Bilateral )  Patient Location: PACU  Anesthesia Type:General  Level of Consciousness: sedated  Airway & Oxygen Therapy: Patient Spontanous Breathing and Patient connected to face mask oxygen  Post-op Assessment: Report given to RN and Post -op Vital signs reviewed and stable  Post vital signs: Reviewed and stable  Last Vitals:  Vitals Value Taken Time  BP 113/69 02/26/19 1414  Temp    Pulse 71 02/26/19 1416  Resp 17 02/26/19 1416  SpO2 100 % 02/26/19 1416  Vitals shown include unvalidated device data.  Last Pain:  Vitals:   02/26/19 1000  TempSrc: Tympanic         Complications: No apparent anesthesia complications

## 2019-02-26 NOTE — Progress Notes (Signed)
CH visited pt. while rounding on SDS.  Pt. awake, sitting in bed, awaiting surgery.  Pt. shared she is @ [facility] for surgery to treat complications w/hysterectomy.  Pt. requested prayer; said her psator normally comes to the hospital but cannot due to COVID.  Pt. concerned about three dtrs. @ home w/husband Jonny Ruiz) as sole caregiver, temporarily.  CH prayed for pt.'s requests and informed her of availability as needed.  Visit truncated by arrival of RN to take pt. to surgery.      02/26/19 1300  Clinical Encounter Type  Visited With Patient  Visit Type Initial  Spiritual Encounters  Spiritual Needs Prayer;Emotional  Stress Factors  Patient Stress Factors Family relationships;Health changes  Family Stress Factors Family relationships;Lack of caregivers;Health changes

## 2019-02-26 NOTE — Discharge Instructions (Signed)
Discharge instructions after   total laparoscopic hysterectomy   For the next three days, take ibuprofen and acetaminophen on a schedule, every 8 hours. You can take them together or you can intersperse them, and take one every four hours. I also gave you gabapentin for nighttime, to help you sleep and also to control pain. Take gabapentin medicines at night for at least the next 3 nights. You also have a narcotic, oxycodone, to take as needed if the above medicines don't help.  Postop constipation is a major cause of pain. Stay well hydrated, walk as you tolerate, and take over the counter senna as well as stool softeners if you need them.    Signs and Symptoms to Report Call our office at [phone] if you have any of the following.  . Fever over 100.4 degrees or higher . Severe stomach pain not relieved with pain medications . Bright red bleeding that's heavier than a period that does not slow with rest . To go the bathroom a lot (frequency), you can't hold your urine (urgency), or it hurts when you empty your bladder (urinate) . Chest pain . Shortness of breath . Pain in the calves of your legs . Severe nausea and vomiting not relieved with anti-nausea medications . Signs of infection around your wounds, such as redness, hot to touch, swelling, green/yellow drainage (like pus), bad smelling discharge . Any concerns  What You Can Expect after Surgery . You may see some pink tinged, bloody fluid and bruising around the wound. This is normal. . You may notice shoulder and neck pain. This is caused by the gas used during surgery to expand your abdomen so your surgeon could get to the uterus easier. . You may have a sore throat because of the tube in your mouth during general anesthesia. This will go away in 2 to 3 days. . You may have some stomach cramps. . You may notice spotting on your panties. . You may have pain around the incision sites.   Activities after Your  Discharge Follow these guidelines to help speed your recovery at home: . Do the coughing and deep breathing as you did in the hospital for 2 weeks. Use the small blue breathing device, called the incentive spirometer for 2 weeks. . Don't drive if you are in pain or taking narcotic pain medicine. You may drive when you can safely slam on the brakes, turn the wheel forcefully, and rotate your torso comfortably. This is typically 1-2 weeks. Practice in a parking lot or side street prior to attempting to drive regularly.  . Ask others to help with household chores for 4 weeks. . Do not lift anything heavier that 10 pounds for 4-6 weeks. This includes pets, children, and groceries. . Don't do strenuous activities, exercises, or sports like vacuuming, tennis, squash, etc. until your doctor says it is safe to do so. ---Maintain pelvic rest for 8 weeks. This means nothing in the vagina or rectum at all (no douching, tampons, intercourse) for 8 weeks.  . Walk as you feel able. Rest often since it may take two or three weeks for your energy level to return to normal.  . You may climb stairs . Avoid constipation:   -Eat fruits, vegetables, and whole grains. Eat small meals as your appetite will take time to return to normal.   -Drink 6 to 8 glasses of water each day unless your doctor has told you to limit your fluids.   -Use a laxative   or stool softener as needed if constipation becomes a problem. You may take Miralax, metamucil, Citrucil, Colace, Senekot, FiberCon, etc. If this does not relieve the constipation, try two tablespoons of Milk Of Magnesia every 8 hours until your bowels move.  . You may shower. Gently wash the wounds with a mild soap and water. Pat dry. . Do not get in a hot tub, swimming pool, etc. for 6 weeks. . Do not use lotions, oils, powders on the wounds. . Do not douche, use tampons, or have sex until your doctor says it is okay. . Take your pain medicine when you need it. The medicine  may not work as well if the pain is bad.  Take the medicines you were taking before surgery. Other medications you will need are pain medications and possibly constipation and nausea medications (Zofran).    Here is a helpful article from the website [employer], regarding constipation  Here are reasons why constipation occurs after surgery: 1) During the operation and in the recovery room, most people are given opioid pain medication, primarily through an IV, to treat moderate or severe pain. Intravenous opioids include morphine, Dilaudid and fentanyl. After surgery, patients are often prescribed opioid pain medication to take by mouth at home, including codeine, Vicodin, Norco, and Percocet. All of these medications cause constipation by slowing down the movement of your intestine. 2) Changes in your diet before surgery can be another culprit. It is common to get specific instructions to change how you normally eat or drink before your surgery, like only having liquids the day before or not having anything to eat or drink after midnight the night before surgery. For this reason, temporary dehydration may occur. This, along with not eating or only having liquids, means that you are getting less fiber than usual. Both these factors contribute to constipation. 3) Changes in your diet after surgery can also contribute to the problem. Although many people don't have dietary restrictions after operations, being under anesthesia can make you lose your appetite for several hours and maybe even days. Some people can even have nausea or vomiting. Not eating or drinking normally means that you are not getting enough fiber and you can get dehydrated, both leading to constipation. 4) Lying in a bed more than usual--which happens before, during and after surgery--combined with the medications and diet changes, all work together to slow down your colon and make your poop turn to rock.  No one likes to be  constipated.  Let's face it, it's not a pleasant feeling when you don't poop for days, then strain on the toilet to finally pass something large enough to cause damage. An ounce of prevention is worth a pound of cure, so: 1. Assume you will be constipated. 2. Plan and prepare accordingly. Post-surgery is one of those unique situations where the temporary use of laxatives can make a world of difference. Always consult with your doctor, and recognize that if you wait several days after surgery to take a laxative, the constipation might be too severe for these over-the-counter options. It is always important to discuss all medications you plan on taking with your doctor. Ask your doctor if you can start the laxative immediately after surgery. *  Here are go-to post-surgery laxatives: Senna: Senna is an herb that acts as a "stimulant laxative," meaning it increases the activity of the intestine to cause you to have a bowel movement. It comes in many forms, but senna pills are easy to take and  are sold over the counter at almost all pharmacies. Since opioid pain medications slow down the activity of the intestine, it makes sense to take a medication to help reverse that side effect. Long-term use of a stimulant laxative is not a good idea since it can make your colon "lazy" and not function properly; however, temporary use immediately after surgery is acceptable. In general, if you are able to eat a normal diet, taking senna soon after surgery works the best. Senna usually works within hours to produce a bowel movement, but this is less predictable when you are taking different medications after surgery. Try not to wait several days to start taking senna, as often it is too late by then. Just like with all medications or supplements, check with your doctor before starting new treatment.   Magnesium: Magnesium is an important mineral that our body needs. We get magnesium from some foods that we eat, especially  foods that are high in fiber such as broccoli, almonds and whole grains. There are also magnesium-based medications used to treat constipation including milk of magnesia (magnesium hydroxide), magnesium citrate and magnesium oxide. They work by drawing water into the intestine, putting it into the class of "osmotic" laxatives. Magnesium products in low doses appear to be safe, but if taken in very large doses, can lead to problems such as irregular heartbeat, low blood pressure and even death. It can also affect other medications you might be taking, therefore it is important to discuss using magnesium with your physician and pharmacist before initiating therapy. Most over-the-counter magnesium laxatives work very well to help with the constipation related to surgery, but sometimes they work too well and lead to diarrhea. Make sure you are somewhere with easy access to a bathroom, just in case.   Bisacodyl: Bisacodyl (generic name) is sold under brand names such as Dulcolax. Much like senna, it is a "stimulant laxative," meaning it makes your intestines move more quickly to push out the stool. This is another good choice to start taking as soon as your doctor says you can take a laxative after surgery. It comes in pill form and as a suppository, which is a good choice for people who cannot or are not allowed to swallow pills. Studies have shown that it works as a laxative, but like most of these medications, you should use this on a short-term basis only.   Enema: Enemas strike fear in many people, but FEAR NOT! It's nowhere near as big a deal as you may think. An enema is just a way to get some liquid into your rectum by placing a specially designed device through your anus. If you have never done one, it might seem like a painful, unpleasant, uncomfortable, complicated and lengthy procedure. But in reality, it's simple, takes just a few seconds and is highly effective. The small ready-made bottles you buy at  the pharmacy are much easier than the hose/large rubber container type. Those recommended positions illustrated in some instructions are generally not necessary to place the enema. It's very similar to the insertion of a tampon, requiring a slight squat. Some extra lubrication on the enema's tip (or on your anus) will make it a breeze. In certain cases, there is no substitute for a good enema. For example, if someone has not pooped for a few days, the beginning of the poop waiting to come out can become rock hard. Passing that hard stool can lead to much pain and problems like anal fissures. Inserting  a little liquid to break up the rock-hard stool will help make its passage much easier. Enemas come with different liquids. Most come with saline, but there are also mineral oil options. You can also use warm water in the reusable enema containers. They all work. But since saline can sometimes be irritating, so try a mineral oil or water enema instead.  Here are commonly recommended constipation medications that do not work well for post-surgery constipation: Docusate: Docusate (generic name) most commonly referred to as Colace (brand name) is not really a laxative, but is classified as a stool softener. Although this medication is commonly prescribed, it is not recommended for several reasons: 1) there is no good medical evidence that it works 2) even if it has an effect, which is very questionable, it is minimal and cannot combat the intestinal slowing caused by the opioid medications. Skip docusate to save money and space in your pillbox for something more effective.  PEG: Miralax (brand name) is basically a chemical called polyethylene glycol (PEG) and it has gained tremendous popularity as a laxative. This product is an "osmotic laxative" meaning it works by pulling water into the stool, making it softer. This is very similar to the action of natural fiber in foods and supplements. Therefore, the effect seen  by this medication is not immediate, causing a bowel movement in a day or more. Is this medication strong enough to battle the constipation related to having an operation? Maybe for some people not prone to constipation. But for most people, other laxatives are better to prevent constipation after surgery.

## 2019-02-26 NOTE — Anesthesia Preprocedure Evaluation (Signed)
Anesthesia Evaluation  Patient identified by MRN, date of birth, ID band Patient awake    Reviewed: Allergy & Precautions, NPO status , Patient's Chart, lab work & pertinent test results  History of Anesthesia Complications Negative for: history of anesthetic complications  Airway Mallampati: II  TM Distance: >3 FB Neck ROM: Full    Dental no notable dental hx.    Pulmonary    breath sounds clear to auscultation- rhonchi (-) wheezing      Cardiovascular Exercise Tolerance: Good (-) hypertension(-) CAD, (-) Past MI, (-) Cardiac Stents and (-) CABG  Rhythm:Regular Rate:Normal - Systolic murmurs and - Diastolic murmurs    Neuro/Psych PSYCHIATRIC DISORDERS Anxiety Depression    GI/Hepatic negative GI ROS, Neg liver ROS,   Endo/Other  negative endocrine ROSneg diabetes  Renal/GU negative Renal ROS     Musculoskeletal negative musculoskeletal ROS (+)   Abdominal (+) - obese,   Peds  Hematology  (+) anemia ,   Anesthesia Other Findings Past Medical History: No date: Anemia No date: Anxiety No date: Complication of anesthesia     Comment:  did not get complete effects from spinal No date: Depression No date: Insomnia   Reproductive/Obstetrics                            Anesthesia Physical Anesthesia Plan  ASA: II  Anesthesia Plan: General   Post-op Pain Management:    Induction: Intravenous  PONV Risk Score and Plan: 2 and Dexamethasone, Midazolam and Promethazine  Airway Management Planned: Oral ETT  Additional Equipment:   Intra-op Plan:   Post-operative Plan: Extubation in OR  Informed Consent: I have reviewed the patients History and Physical, chart, labs and discussed the procedure including the risks, benefits and alternatives for the proposed anesthesia with the patient or authorized representative who has indicated his/her understanding and acceptance.     Dental  advisory given  Plan Discussed with: CRNA and Anesthesiologist  Anesthesia Plan Comments:         Anesthesia Quick Evaluation

## 2019-02-26 NOTE — Progress Notes (Signed)
Pt up to spontaneous void, tolerating po, already walking through unit. Vitals stable.  Vitals:   02/26/19 1545 02/26/19 1648  BP: 115/71 97/68  Pulse: 74 72  Resp: 14 16  Temp: 98.6 F (37 C) 97.8 F (36.6 C)  SpO2: 96% 98%   A/P: POD#0 TLH/BS  Pain meds PRN Encourage ambulation Advance diet as tolerated Repeat labs in am Incentive spir encouraged.

## 2019-02-26 NOTE — Anesthesia Postprocedure Evaluation (Signed)
Anesthesia Post Note  Patient: Chelsea Carter  Procedure(s) Performed: HYSTERECTOMY TOTAL LAPAROSCOPIC (N/A ) LAPAROSCOPIC BILATERAL SALPINGECTOMY (Bilateral )  Patient location during evaluation: PACU Anesthesia Type: General Level of consciousness: awake and alert and oriented Pain management: pain level controlled Vital Signs Assessment: post-procedure vital signs reviewed and stable Respiratory status: spontaneous breathing, nonlabored ventilation and respiratory function stable Cardiovascular status: blood pressure returned to baseline and stable Postop Assessment: no signs of nausea or vomiting Anesthetic complications: no     Last Vitals:  Vitals:   02/26/19 1524 02/26/19 1545  BP: 104/72 115/71  Pulse: 71 74  Resp: 13 14  Temp:  37 C  SpO2: 97% 96%    Last Pain:  Vitals:   02/26/19 1600  TempSrc:   PainSc: (P) 3                  Journi Moffa

## 2019-02-26 NOTE — Anesthesia Procedure Notes (Signed)
Procedure Name: Intubation Performed by: Bayler Gehrig Ben, CRNA Pre-anesthesia Checklist: Patient identified, Emergency Drugs available, Suction available and Patient being monitored Patient Re-evaluated:Patient Re-evaluated prior to induction Oxygen Delivery Method: Circle system utilized Preoxygenation: Pre-oxygenation with 100% oxygen Induction Type: IV induction Ventilation: Mask ventilation without difficulty Laryngoscope Size: Mac and 3 Grade View: Grade I Tube type: Oral Tube size: 7.0 mm Number of attempts: 1 Airway Equipment and Method: Stylet and Oral airway Placement Confirmation: ETT inserted through vocal cords under direct vision,  positive ETCO2 and breath sounds checked- equal and bilateral Secured at: 21 cm Tube secured with: Tape Dental Injury: Teeth and Oropharynx as per pre-operative assessment        

## 2019-02-26 NOTE — Op Note (Signed)
Chelsea Carter PROCEDURE DATE: 02/26/2019  PREOPERATIVE DIAGNOSIS: Sx anemia, abnormal uterine bleeding POSTOPERATIVE DIAGNOSIS: The same PROCEDURE:  HYSTERECTOMY TOTAL LAPAROSCOPIC:  LAPAROSCOPIC BILATERAL SALPINGECTOMY:   SURGEON:  Dr. Christeen Douglas ASSISTANT: Dr. Leeroy Bock Ward  Anesthesiologist:  Anesthesiologist: Alver Fisher, MD CRNA: Berniece Pap, CRNA; Rosanne Gutting, CRNA  INDICATIONS: 44 y.o. F with hx of sx anemia, menorrhagia and failed medical management, who is  here for definitive surgical management secondary to the indications listed under preoperative diagnoses; please see preoperative note for further details.  Risks of surgery were discussed with the patient including but not limited to: bleeding which may require transfusion or reoperation; infection which may require antibiotics; injury to bowel, bladder, ureters or other surrounding organs; need for additional procedures; thromboembolic phenomenon, incisional problems and other postoperative/anesthesia complications. Written informed consent was obtained.    FINDINGS:  Small normal uterus, tubes and ovaries. Normal appendix, normal upper abdominal organs.  ANESTHESIA:    General INTRAVENOUS FLUIDS:900  ml ESTIMATED BLOOD LOSS:50 ml URINE OUTPUT: 400 ml   SPECIMENS: Uterus, cervix, bilateral fallopian tubes  COMPLICATIONS: None immediate  PROCEDURE IN DETAIL:  The patient received prophalactic intravenous antibiotics and had sequential compression devices applied to her lower extremities while in the preoperative area.  She was then taken to the operating room where general anesthesia was administered and was found to be adequate.  She was placed in the dorsal lithotomy position, and was prepped and draped in a sterile manner.  A formal time out was performed with all team members present and in agreement.  A V-care uterine manipulator was placed at this time.  A Foley catheter was inserted into her bladder  and attached to constant drainage. Attention was turned to the abdomen and 0.5% Marcaine infused subq. A 4mm umbilical incision was made with the scalpel.  The Optiview 5-mm trocar and sleeve were then advanced without difficulty with the laparoscope under direct visualization into the abdomen.  The abdomen was then insufflated with carbon dioxide gas and adequate pneumoperitoneum was obtained.  A survey of the patient's pelvis and abdomen revealed the findings above.  Bilateral lower quadrant ports (5 mm on the right and 11 mm on the left) were then placed under direct visualization.  The pelvis was then carefully examined.  Attention was turned to the fallopian tubes; these were freed from the underlying mesosalpinx and the uterine attachments using the Ligasure device.  The bilateral round and broad ligaments were then clamped and transected with the Ligasure device.  The uterine artery was then skeletonized and a bladder flap was created.  The ureters were noted to be safely away from the area of dissection.  The bladder was then bluntly dissected off the lower uterine segment.    At this point, attention was turned to the uterine vessels, which were clamped and cauterized using the Ligasure on the left, and then the right. After the uterine blood flow at the level of the internal os was controlled, both arteries were cut with the Ligasure.  Good hemostasis was noted overall.  The uterosacral and cardinal ligaments were clamped, cut and ligated bilaterally .  Attention was then turned to the cervicovaginal junction, and monopolar scissors were used to transect the cervix from the surrounding vagina using the ring of the V-care as a guide. This was done circumferentially allowing total hysterectomy.  The uterus was then removed from the vagina and the vaginal cuff incision was then closed with running V-loc suture.  Overall  excellent hemostasis was noted.    Attention was returned to the abdomen.The ureters  were reexamined bilaterally and were pulsating normally. The abdominal pressure was reduced and hemostasis was confirmed.       The 19mm port fascia was closed with a vertical mattress with 0-Vicryl, using the cone closure system. All trocars were removed under direct visualization, and the abdomen was desufflated.  All skin incisions were closed with 4-0 Vicryl subcuticular stitches and Dermabond. The patient tolerated the procedures well.  All instruments, needles, and sponge counts were correct x 2. The patient was taken to the recovery room awake, extubated and in stable condition.

## 2019-02-26 NOTE — Interval H&P Note (Signed)
History and Physical Interval Note:  02/26/2019 11:20 AM  Chelsea Carter  has presented today for surgery, with the diagnosis of Menorrhagia, Anemia.  The various methods of treatment have been discussed with the patient and family. After consideration of risks, benefits and other options for treatment, the patient has consented to  Procedure(s): HYSTERECTOMY TOTAL LAPAROSCOPIC (N/A) LAPAROSCOPIC BILATERAL SALPINGECTOMY (Bilateral) as a surgical intervention.  The patient's history has been reviewed, patient examined, no change in status, stable for surgery.  I have reviewed the patient's chart and labs.  Questions were answered to the patient's satisfaction.    Her hgb after iv iron infusion is 11 today, and reassuring. Will monitor.  Christeen Douglas

## 2019-02-27 DIAGNOSIS — N920 Excessive and frequent menstruation with regular cycle: Secondary | ICD-10-CM | POA: Diagnosis not present

## 2019-02-27 LAB — CBC
HCT: 30.7 % — ABNORMAL LOW (ref 36.0–46.0)
Hemoglobin: 10.1 g/dL — ABNORMAL LOW (ref 12.0–15.0)
MCH: 29.6 pg (ref 26.0–34.0)
MCHC: 32.9 g/dL (ref 30.0–36.0)
MCV: 90 fL (ref 80.0–100.0)
Platelets: 228 10*3/uL (ref 150–400)
RBC: 3.41 MIL/uL — ABNORMAL LOW (ref 3.87–5.11)
RDW: 16.3 % — ABNORMAL HIGH (ref 11.5–15.5)
WBC: 9.2 10*3/uL (ref 4.0–10.5)
nRBC: 0 % (ref 0.0–0.2)

## 2019-02-27 LAB — BASIC METABOLIC PANEL
Anion gap: 7 (ref 5–15)
BUN: 8 mg/dL (ref 6–20)
CO2: 24 mmol/L (ref 22–32)
Calcium: 8.7 mg/dL — ABNORMAL LOW (ref 8.9–10.3)
Chloride: 108 mmol/L (ref 98–111)
Creatinine, Ser: 0.73 mg/dL (ref 0.44–1.00)
GFR calc Af Amer: >60 mL/min (ref >60)
GFR calc non Af Amer: >60 mL/min (ref >60)
Glucose, Bld: 124 mg/dL — ABNORMAL HIGH (ref 70–99)
Potassium: 4 mmol/L (ref 3.5–5.1)
Sodium: 139 mmol/L (ref 135–145)

## 2019-02-27 LAB — SURGICAL PATHOLOGY

## 2019-02-27 MED ORDER — GABAPENTIN 800 MG PO TABS: 800.0000 mg | ORAL_TABLET | Freq: Every day | ORAL | 0 refills | Status: AC

## 2019-02-27 MED ORDER — OXYCODONE HCL 5 MG PO CAPS: 5.0000 mg | ORAL_CAPSULE | Freq: Four times a day (QID) | ORAL | 0 refills | Status: DC | PRN

## 2019-02-27 MED ORDER — DOCUSATE SODIUM 100 MG PO CAPS: 100.0000 mg | ORAL_CAPSULE | Freq: Two times a day (BID) | ORAL | 0 refills | Status: DC

## 2019-02-27 MED ORDER — ACETAMINOPHEN 500 MG PO TABS: 1000.0000 mg | ORAL_TABLET | Freq: Four times a day (QID) | ORAL | 0 refills | Status: AC

## 2019-02-27 MED ORDER — IBUPROFEN 800 MG PO TABS: 800.0000 mg | ORAL_TABLET | Freq: Three times a day (TID) | ORAL | 1 refills | Status: AC

## 2019-02-27 NOTE — Discharge Summary (Signed)
1 Day Post-Op       Procedure(s): HYSTERECTOMY TOTAL LAPAROSCOPIC (N/A) LAPAROSCOPIC BILATERAL SALPINGECTOMY (Bilateral) Subjective: The patient is doing well.  No nausea or vomiting. Pain is adequately controlled.  Objective: Vital signs in last 24 hours: Temp:  [97.8 F (36.6 C)-99 F (37.2 C)] 99 F (37.2 C) (01/19 0750) Pulse Rate:  [67-86] 81 (01/19 0750) Resp:  [11-21] 18 (01/19 0750) BP: (96-115)/(58-75) 109/67 (01/19 0750) SpO2:  [96 %-100 %] 98 % (01/19 0750)  Intake/Output  Intake/Output Summary (Last 24 hours) at 02/27/2019 1027 Last data filed at 02/27/2019 0755 Gross per 24 hour  Intake 1440 ml  Output 1350 ml  Net 90 ml    Physical Exam:  General: Alert and oriented. CV: RRR Lungs: Clear bilaterally. GI: Soft, Nondistended. Incisions: Clean and dry. Extremities: Nontender, no erythema, no edema.  Lab Results: Recent Labs    02/27/19 0615  HGB 10.1*  HCT 30.7*  WBC 9.2  PLT 228                 Results for orders placed or performed during the hospital encounter of 02/26/19 (from the past 24 hour(s))  CBC     Status: Abnormal   Collection Time: 02/27/19  6:15 AM  Result Value Ref Range   WBC 9.2 4.0 - 10.5 K/uL   RBC 3.41 (L) 3.87 - 5.11 MIL/uL   Hemoglobin 10.1 (L) 12.0 - 15.0 g/dL   HCT 23.5 (L) 36.1 - 44.3 %   MCV 90.0 80.0 - 100.0 fL   MCH 29.6 26.0 - 34.0 pg   MCHC 32.9 30.0 - 36.0 g/dL   RDW 15.4 (H) 00.8 - 67.6 %   Platelets 228 150 - 400 K/uL   nRBC 0.0 0.0 - 0.2 %  Basic metabolic panel     Status: Abnormal   Collection Time: 02/27/19  6:15 AM  Result Value Ref Range   Sodium 139 135 - 145 mmol/L   Potassium 4.0 3.5 - 5.1 mmol/L   Chloride 108 98 - 111 mmol/L   CO2 24 22 - 32 mmol/L   Glucose, Bld 124 (H) 70 - 99 mg/dL   BUN 8 6 - 20 mg/dL   Creatinine, Ser 1.95 0.44 - 1.00 mg/dL   Calcium 8.7 (L) 8.9 - 10.3 mg/dL   GFR calc non Af Amer >60 >60 mL/min   GFR calc Af Amer >60 >60 mL/min   Anion gap 7 5 - 15     Assessment/Plan: 1 Day Post-Op       Procedure(s): HYSTERECTOMY TOTAL LAPAROSCOPIC (N/A) LAPAROSCOPIC BILATERAL SALPINGECTOMY (Bilateral)  1) Ambulate, Incentive spirometry 2) Advance diet as tolerated 3) Transition to oral pain medication 4) Discharge home today anticipated    Christeen Douglas, MD   LOS: 0 days   Christeen Douglas 02/27/2019, 10:27 AM

## 2019-02-27 NOTE — Progress Notes (Signed)
Patient discharged home. Discharge instructions, prescriptions and follow up appointment given to and reviewed with patient. Patient verbalized understanding. 

## 2019-03-09 ENCOUNTER — Other Ambulatory Visit: Payer: Self-pay | Admitting: Obstetrics and Gynecology

## 2019-03-09 ENCOUNTER — Other Ambulatory Visit: Payer: BC Managed Care – PPO

## 2019-03-09 DIAGNOSIS — R102 Pelvic and perineal pain: Secondary | ICD-10-CM

## 2019-03-09 DIAGNOSIS — G8918 Other acute postprocedural pain: Secondary | ICD-10-CM

## 2019-03-13 ENCOUNTER — Ambulatory Visit: Payer: BC Managed Care – PPO | Admitting: Oncology

## 2019-03-13 ENCOUNTER — Ambulatory Visit: Payer: BC Managed Care – PPO

## 2019-03-15 ENCOUNTER — Ambulatory Visit: Admission: RE | Admit: 2019-03-15 | Payer: BC Managed Care – PPO | Source: Ambulatory Visit

## 2019-03-15 ENCOUNTER — Other Ambulatory Visit: Payer: Self-pay | Admitting: Family Medicine

## 2019-03-15 DIAGNOSIS — N63 Unspecified lump in unspecified breast: Secondary | ICD-10-CM

## 2019-03-20 ENCOUNTER — Other Ambulatory Visit: Payer: Self-pay | Admitting: Family Medicine

## 2019-03-20 DIAGNOSIS — N63 Unspecified lump in unspecified breast: Secondary | ICD-10-CM

## 2019-04-02 ENCOUNTER — Other Ambulatory Visit: Payer: BC Managed Care – PPO

## 2019-04-04 ENCOUNTER — Ambulatory Visit: Payer: BC Managed Care – PPO

## 2019-04-04 ENCOUNTER — Ambulatory Visit
Admission: RE | Admit: 2019-04-04 | Discharge: 2019-04-04 | Disposition: A | Discharge status: Home / Self Care | Payer: BC Managed Care – PPO | Source: Ambulatory Visit | Attending: Family Medicine | Admitting: Family Medicine

## 2019-04-04 ENCOUNTER — Ambulatory Visit: Payer: BC Managed Care – PPO | Admitting: Oncology

## 2019-04-04 ENCOUNTER — Other Ambulatory Visit: Payer: Self-pay | Admitting: Family Medicine

## 2019-04-04 DIAGNOSIS — N63 Unspecified lump in unspecified breast: Secondary | ICD-10-CM | POA: Insufficient documentation

## 2019-04-06 ENCOUNTER — Ambulatory Visit: Admission: RE | Admit: 2019-04-06 | Payer: BC Managed Care – PPO | Source: Ambulatory Visit

## 2019-04-09 ENCOUNTER — Other Ambulatory Visit: Payer: Self-pay | Admitting: Family Medicine

## 2019-04-09 DIAGNOSIS — N6001 Solitary cyst of right breast: Secondary | ICD-10-CM

## 2019-04-09 DIAGNOSIS — R928 Other abnormal and inconclusive findings on diagnostic imaging of breast: Secondary | ICD-10-CM

## 2019-04-20 ENCOUNTER — Ambulatory Visit
Admission: RE | Admit: 2019-04-20 | Discharge: 2019-04-20 | Disposition: A | Discharge status: Home / Self Care | Payer: BC Managed Care – PPO | Source: Ambulatory Visit | Attending: Family Medicine | Admitting: Family Medicine

## 2019-04-20 DIAGNOSIS — N6001 Solitary cyst of right breast: Secondary | ICD-10-CM | POA: Insufficient documentation

## 2019-04-20 DIAGNOSIS — R928 Other abnormal and inconclusive findings on diagnostic imaging of breast: Secondary | ICD-10-CM | POA: Insufficient documentation

## 2019-05-24 ENCOUNTER — Other Ambulatory Visit: Payer: Self-pay | Admitting: Family Medicine

## 2019-05-24 DIAGNOSIS — N6452 Nipple discharge: Secondary | ICD-10-CM

## 2019-06-04 ENCOUNTER — Other Ambulatory Visit: Payer: BC Managed Care – PPO

## 2019-06-13 ENCOUNTER — Ambulatory Visit
Admission: RE | Admit: 2019-06-13 | Discharge: 2019-06-13 | Disposition: A | Discharge status: Home / Self Care | Payer: BC Managed Care – PPO | Source: Ambulatory Visit | Attending: Family Medicine | Admitting: Family Medicine

## 2019-06-13 DIAGNOSIS — N6452 Nipple discharge: Secondary | ICD-10-CM | POA: Insufficient documentation

## 2019-06-14 ENCOUNTER — Other Ambulatory Visit: Payer: Self-pay | Admitting: Family Medicine

## 2019-06-14 DIAGNOSIS — R928 Other abnormal and inconclusive findings on diagnostic imaging of breast: Secondary | ICD-10-CM

## 2019-06-21 ENCOUNTER — Ambulatory Visit
Admission: RE | Admit: 2019-06-21 | Discharge: 2019-06-21 | Disposition: A | Discharge status: Home / Self Care | Payer: BC Managed Care – PPO | Source: Ambulatory Visit | Attending: Family Medicine | Admitting: Family Medicine

## 2019-06-21 DIAGNOSIS — R928 Other abnormal and inconclusive findings on diagnostic imaging of breast: Secondary | ICD-10-CM | POA: Diagnosis not present

## 2019-06-21 HISTORY — PX: BREAST BIOPSY: SHX20

## 2019-06-22 LAB — SURGICAL PATHOLOGY

## 2019-08-16 ENCOUNTER — Other Ambulatory Visit
Admission: RE | Admit: 2019-08-16 | Discharge: 2019-08-16 | Disposition: A | Discharge status: Home / Self Care | Payer: BC Managed Care – PPO | Attending: Oncology | Admitting: Oncology

## 2019-08-16 DIAGNOSIS — D500 Iron deficiency anemia secondary to blood loss (chronic): Secondary | ICD-10-CM | POA: Diagnosis present

## 2019-08-16 DIAGNOSIS — R102 Pelvic and perineal pain unspecified side: Secondary | ICD-10-CM

## 2019-08-16 DIAGNOSIS — G8918 Other acute postprocedural pain: Secondary | ICD-10-CM

## 2019-08-16 LAB — CBC WITH DIFFERENTIAL/PLATELET
Abs Immature Granulocytes: 0.01 10*3/uL (ref 0.00–0.07)
Basophils Absolute: 0 10*3/uL (ref 0.0–0.1)
Basophils Relative: 1 %
Eosinophils Absolute: 0.1 10*3/uL (ref 0.0–0.5)
Eosinophils Relative: 2 %
HCT: 34.6 % — ABNORMAL LOW (ref 36.0–46.0)
Hemoglobin: 12 g/dL (ref 12.0–15.0)
Immature Granulocytes: 0 %
Lymphocytes Relative: 25 %
Lymphs Abs: 1.2 10*3/uL (ref 0.7–4.0)
MCH: 31.7 pg (ref 26.0–34.0)
MCHC: 34.7 g/dL (ref 30.0–36.0)
MCV: 91.3 fL (ref 80.0–100.0)
Monocytes Absolute: 0.4 10*3/uL (ref 0.1–1.0)
Monocytes Relative: 8 %
Neutro Abs: 3 10*3/uL (ref 1.7–7.7)
Neutrophils Relative %: 64 %
Platelets: 215 10*3/uL (ref 150–400)
RBC: 3.79 MIL/uL — ABNORMAL LOW (ref 3.87–5.11)
RDW: 12.1 % (ref 11.5–15.5)
WBC: 4.6 10*3/uL (ref 4.0–10.5)
nRBC: 0 % (ref 0.0–0.2)

## 2019-08-16 LAB — IRON AND TIBC
Iron: 158 ug/dL (ref 28–170)
Saturation Ratios: 50 % — ABNORMAL HIGH (ref 10.4–31.8)
TIBC: 314 ug/dL (ref 250–450)
UIBC: 156 ug/dL

## 2019-08-16 LAB — FERRITIN: Ferritin: 34 ng/mL (ref 11–307)

## 2019-08-17 ENCOUNTER — Other Ambulatory Visit: Payer: Self-pay

## 2019-08-17 ENCOUNTER — Inpatient Hospital Stay (HOSPITAL_BASED_OUTPATIENT_CLINIC_OR_DEPARTMENT_OTHER): Payer: BC Managed Care – PPO | Admitting: Oncology

## 2019-08-17 ENCOUNTER — Inpatient Hospital Stay: Payer: BC Managed Care – PPO | Attending: Oncology

## 2019-08-17 ENCOUNTER — Encounter: Payer: Self-pay | Admitting: Oncology

## 2019-08-17 DIAGNOSIS — F329 Major depressive disorder, single episode, unspecified: Secondary | ICD-10-CM | POA: Insufficient documentation

## 2019-08-17 DIAGNOSIS — Z90710 Acquired absence of both cervix and uterus: Secondary | ICD-10-CM | POA: Insufficient documentation

## 2019-08-17 DIAGNOSIS — D500 Iron deficiency anemia secondary to blood loss (chronic): Secondary | ICD-10-CM | POA: Diagnosis not present

## 2019-08-17 DIAGNOSIS — Z79899 Other long term (current) drug therapy: Secondary | ICD-10-CM | POA: Insufficient documentation

## 2019-08-17 DIAGNOSIS — Z9079 Acquired absence of other genital organ(s): Secondary | ICD-10-CM | POA: Insufficient documentation

## 2019-08-17 DIAGNOSIS — F419 Anxiety disorder, unspecified: Secondary | ICD-10-CM | POA: Insufficient documentation

## 2019-08-17 NOTE — Progress Notes (Signed)
Patient stated that she is still having lower back pain and she also stated she is has uti. Patient stated that she does not think she has uti and thinks its something more. Patient stated that she does have tingling in her legs and believe its from the back pain.

## 2019-08-17 NOTE — Progress Notes (Signed)
HEMATOLOGY-ONCOLOGY TeleHEALTH VISIT PROGRESS NOTE  I connected with Chelsea Carter on 08/17/19 at  2:30 PM EDT by video enabled telemedicine visit and verified that I am speaking with the correct person using two identifiers. I discussed the limitations, risks, security and privacy concerns of performing an evaluation and management service by telemedicine and the availability of in-person appointments. I also discussed with the patient that there may be a patient responsible charge related to this service. The patient expressed understanding and agreed to proceed.   Other persons participating in the visit and their role in the encounter:  None  Patient's location: Home  Provider's location: office Chief Complaint:    INTERVAL HISTORY Chelsea Carter is a 44 y.o. female who has above history reviewed by me today presents for follow up visit for management of iron deficiency anemia, Problems and complaints are listed below:  02/26/2019, total hysterectomy with bilateral salpingectomy 06/21/2019, right breast biopsy showed fibroadenoma.  Negative for atypia and malignancy. Today patient reports that she has UTI and currently on antibiotics.  Also reports lower back pain. UTI is being managed by primary care provider.  Review of Systems  Constitutional: Negative for appetite change, chills, fatigue and fever.  HENT:   Negative for hearing loss and voice change.   Eyes: Negative for eye problems.  Respiratory: Negative for chest tightness and cough.   Cardiovascular: Negative for chest pain.  Gastrointestinal: Negative for abdominal distention, abdominal pain and blood in stool.  Endocrine: Negative for hot flashes.  Genitourinary: Negative for difficulty urinating and frequency.   Musculoskeletal: Positive for back pain. Negative for arthralgias.  Skin: Negative for itching and rash.  Neurological: Negative for extremity weakness.  Hematological: Negative for adenopathy.   Psychiatric/Behavioral: Negative for confusion.    Past Medical History:  Diagnosis Date  . Anemia   . Anxiety   . Complication of anesthesia    did not get complete effects from spinal  . Depression   . Insomnia    Past Surgical History:  Procedure Laterality Date  . BREAST BIOPSY Right 06/21/2019   [place] bx, vensu marker, path pending  . BREAST CYST ASPIRATION Right 08/27/2015   neg  . BREAST CYST EXCISION Right 02/04/2012   removal of fibroadenoma  . heel spurs    . LAPAROSCOPIC BILATERAL SALPINGECTOMY Bilateral 02/26/2019   Procedure: LAPAROSCOPIC BILATERAL SALPINGECTOMY;  Surgeon: Christeen Douglas, MD;  Location: [facility];  Service: Gynecology;  Laterality: Bilateral;  . LAPAROSCOPIC HYSTERECTOMY N/A 02/26/2019   Procedure: HYSTERECTOMY TOTAL LAPAROSCOPIC;  Surgeon: Christeen Douglas, MD;  Location: [facility];  Service: Gynecology;  Laterality: N/A;  . NASAL SINUS SURGERY    . TUBAL LIGATION      Family History  Problem Relation Age of Onset  . Brain cancer Maternal Uncle   . Breast cancer Neg Hx     Social History   Socioeconomic History  . Marital status: Married    Spouse name: Not on file  . Number of children: Not on file  . Years of education: Not on file  . Highest education level: Not on file  Occupational History  . Not on file  Tobacco Use  . Smoking status: Never Smoker  . Smokeless tobacco: Never Used  Vaping Use  . Vaping Use: Never used  Substance and Sexual Activity  . Alcohol use: Never  . Drug use: Never  . Sexual activity: Yes  Other Topics Concern  . Not on file  Social History Narrative  . Not  on file   Social Determinants of Health   Financial Resource Strain:   . Difficulty of Paying Living Expenses:   Food Insecurity:   . Worried About Programme researcher, broadcasting/film/video in the Last Year:   . Barista in the Last Year:   Transportation Needs:   . Freight forwarder (Medical):   Marland Kitchen Lack of Transportation (Non-Medical):   Physical  Activity:   . Days of Exercise per Week:   . Minutes of Exercise per Session:   Stress:   . Feeling of Stress :   Social Connections:   . Frequency of Communication with Friends and Family:   . Frequency of Social Gatherings with Friends and Family:   . Attends Religious Services:   . Active Member of Clubs or Organizations:   . Attends Banker Meetings:   Marland Kitchen Marital Status:   Intimate Partner Violence:   . Fear of Current or Ex-Partner:   . Emotionally Abused:   Marland Kitchen Physically Abused:   . Sexually Abused:     Current Outpatient Medications on File Prior to Visit  Medication Sig Dispense Refill  . bisacodyl (DULCOLAX) 5 MG EC tablet Take 10 mg by mouth daily.    Marland Kitchen buPROPion (WELLBUTRIN XL) 300 MG 24 hr tablet Take 300 mg by mouth 2 (two) times daily.     . ciprofloxacin (CIPRO) 250 MG tablet Take by mouth.    . clonazePAM (KLONOPIN) 0.5 MG tablet Take 0.5 mg by mouth 2 (two) times daily.     Marland Kitchen docusate sodium (COLACE) 100 MG capsule Take 1 capsule (100 mg total) by mouth 2 (two) times daily. To keep stools soft 30 capsule 0  . docusate sodium (COLACE) 100 MG capsule Take 1 capsule (100 mg total) by mouth 2 (two) times daily. To keep stools soft 30 capsule 0  . escitalopram (LEXAPRO) 20 MG tablet Take 20 mg by mouth every evening.     . fluconazole (DIFLUCAN) 150 MG tablet Take 150 mg by mouth 2 (two) times daily as needed.    . Melatonin 5 MG TABS Take 5 mg by mouth at bedtime as needed (sleep).    Marland Kitchen oxycodone (OXY-IR) 5 MG capsule Take 1 capsule (5 mg total) by mouth every 6 (six) hours as needed for pain. 15 capsule 0  . oxycodone (OXY-IR) 5 MG capsule Take 1 capsule (5 mg total) by mouth every 6 (six) hours as needed for pain. 15 capsule 0  . temazepam (RESTORIL) 15 MG capsule 15 mg at bedtime.     . gabapentin (NEURONTIN) 800 MG tablet Take 1 tablet (800 mg total) by mouth at bedtime for 14 days. Take nightly for 3 days, then up to 14 days as needed 14 tablet 0  .  gabapentin (NEURONTIN) 800 MG tablet Take 1 tablet (800 mg total) by mouth at bedtime for 14 days. Take nightly for 3 days, then up to 14 days as needed 14 tablet 0   No current facility-administered medications on file prior to visit.    Allergies  Allergen Reactions  . Ondansetron Hcl Other (See Comments)    Does not work   . Zolpidem Other (See Comments)    Hallucinations         Observations/Objective: There were no vitals filed for this visit. There is no height or weight on file to calculate BMI.  Physical Exam  CBC    Component Value Date/Time   WBC 4.6 08/16/2019 1110  RBC 3.79 (L) 08/16/2019 1110   HGB 12.0 08/16/2019 1110   HGB 11.6 (L) 02/04/2012 1100   HCT 34.6 (L) 08/16/2019 1110   PLT 215 08/16/2019 1110   MCV 91.3 08/16/2019 1110   MCH 31.7 08/16/2019 1110   MCHC 34.7 08/16/2019 1110   RDW 12.1 08/16/2019 1110   LYMPHSABS 1.2 08/16/2019 1110   MONOABS 0.4 08/16/2019 1110   EOSABS 0.1 08/16/2019 1110   BASOSABS 0.0 08/16/2019 1110    CMP     Component Value Date/Time   NA 139 02/27/2019 0615   K 4.0 02/27/2019 0615   CL 108 02/27/2019 0615   CO2 24 02/27/2019 0615   GLUCOSE 124 (H) 02/27/2019 0615   BUN 8 02/27/2019 0615   CREATININE 0.73 02/27/2019 0615   CALCIUM 8.7 (L) 02/27/2019 0615   PROT 7.8 12/05/2018 1548   ALBUMIN 4.2 12/05/2018 1548   AST 15 12/05/2018 1548   ALT 15 12/05/2018 1548   ALKPHOS 62 12/05/2018 1548   BILITOT 0.3 12/05/2018 1548   GFRNONAA >60 02/27/2019 0615   GFRAA >60 02/27/2019 0615     Assessment and Plan: 1. Iron deficiency anemia due to chronic blood loss     Labs reviewed and discussed with patient. Hemoglobin has completely normalized to 12.  Iron labs showed adequate iron level. No need for additional oral iron supplementation or IV iron. I will discharge patient to follow-up with primary care provider  UTI/flank pain, continue follow-up with primary care provider.  Follow Up Instructions: No need for  follow-up.   I discussed the assessment and treatment plan with the patient. The patient was provided an opportunity to ask questions and all were answered. The patient agreed with the plan and demonstrated an understanding of the instructions.  The patient was advised to call back or seek an in-person evaluation if the symptoms worsen or if the condition fails to improve as anticipated.  Rickard Patience, MD 08/17/2019 10:23 PM

## 2019-08-20 ENCOUNTER — Inpatient Hospital Stay: Payer: BC Managed Care – PPO

## 2020-05-21 ENCOUNTER — Other Ambulatory Visit: Payer: Self-pay | Admitting: Family Medicine

## 2020-05-21 DIAGNOSIS — Z1231 Encounter for screening mammogram for malignant neoplasm of breast: Secondary | ICD-10-CM

## 2020-07-30 ENCOUNTER — Other Ambulatory Visit: Payer: Self-pay

## 2020-07-30 ENCOUNTER — Ambulatory Visit
Admission: RE | Admit: 2020-07-30 | Discharge: 2020-07-30 | Disposition: A | Discharge status: Home / Self Care | Payer: No Typology Code available for payment source | Source: Ambulatory Visit | Attending: Family Medicine | Admitting: Family Medicine

## 2020-07-30 DIAGNOSIS — Z1231 Encounter for screening mammogram for malignant neoplasm of breast: Secondary | ICD-10-CM

## 2020-07-31 ENCOUNTER — Other Ambulatory Visit: Payer: Self-pay | Admitting: Family Medicine

## 2020-07-31 DIAGNOSIS — N6325 Unspecified lump in the left breast, overlapping quadrants: Secondary | ICD-10-CM

## 2020-07-31 DIAGNOSIS — N6315 Unspecified lump in the right breast, overlapping quadrants: Secondary | ICD-10-CM

## 2020-08-06 ENCOUNTER — Other Ambulatory Visit: Payer: No Typology Code available for payment source

## 2020-08-06 ENCOUNTER — Inpatient Hospital Stay: Admission: RE | Admit: 2020-08-06 | Payer: No Typology Code available for payment source | Source: Ambulatory Visit

## 2020-08-14 ENCOUNTER — Ambulatory Visit
Admission: RE | Admit: 2020-08-14 | Discharge: 2020-08-14 | Disposition: A | Discharge status: Home / Self Care | Payer: No Typology Code available for payment source | Source: Ambulatory Visit | Attending: Family Medicine | Admitting: Family Medicine

## 2020-08-14 ENCOUNTER — Other Ambulatory Visit: Payer: Self-pay

## 2020-08-14 DIAGNOSIS — N6315 Unspecified lump in the right breast, overlapping quadrants: Secondary | ICD-10-CM

## 2020-08-14 DIAGNOSIS — N6325 Unspecified lump in the left breast, overlapping quadrants: Secondary | ICD-10-CM

## 2021-09-29 ENCOUNTER — Other Ambulatory Visit: Payer: Self-pay | Admitting: Family Medicine

## 2021-09-29 DIAGNOSIS — Z1231 Encounter for screening mammogram for malignant neoplasm of breast: Secondary | ICD-10-CM

## 2021-10-13 ENCOUNTER — Encounter: Payer: Self-pay | Admitting: Oncology

## 2021-10-14 ENCOUNTER — Encounter: Payer: Self-pay | Admitting: Oncology

## 2021-10-27 ENCOUNTER — Ambulatory Visit: Payer: Medicaid Other

## 2021-11-02 ENCOUNTER — Ambulatory Visit
Admission: RE | Admit: 2021-11-02 | Discharge: 2021-11-02 | Disposition: A | Discharge status: Home / Self Care | Payer: Medicaid Other | Source: Ambulatory Visit | Attending: Family Medicine | Admitting: Family Medicine

## 2021-11-02 DIAGNOSIS — Z1231 Encounter for screening mammogram for malignant neoplasm of breast: Secondary | ICD-10-CM | POA: Diagnosis present

## 2021-11-03 ENCOUNTER — Ambulatory Visit: Payer: Medicaid Other

## 2021-11-12 ENCOUNTER — Ambulatory Visit: Payer: Medicaid Other

## 2021-11-17 ENCOUNTER — Ambulatory Visit: Payer: Medicaid Other

## 2021-11-18 ENCOUNTER — Ambulatory Visit: Payer: Medicaid Other | Attending: Sports Medicine

## 2021-11-18 DIAGNOSIS — M6289 Other specified disorders of muscle: Secondary | ICD-10-CM | POA: Diagnosis present

## 2021-11-18 DIAGNOSIS — M5459 Other low back pain: Secondary | ICD-10-CM | POA: Diagnosis present

## 2021-11-18 DIAGNOSIS — R278 Other lack of coordination: Secondary | ICD-10-CM

## 2021-11-18 NOTE — Therapy (Signed)
OUTPATIENT PHYSICAL THERAPY FEMALE PELVIC EVALUATION   Patient Name: Chelsea Carter MRN: [id number] DOB:12-Nov-1975, 46 y.o., female Today's Date: 11/18/2021   PT End of Session - 11/18/21 1403     Visit Number 1    Number of Visits 10    Date for PT Re-Evaluation 01/27/22    Authorization Type IE: 11/18/21    PT Start Time 1400    PT Stop Time 1450    PT Time Calculation (min) 50 min    Activity Tolerance Patient tolerated treatment well             Past Medical History:  Diagnosis Date   Anemia    Anxiety    Complication of anesthesia    did not get complete effects from spinal   Depression    Insomnia    Past Surgical History:  Procedure Laterality Date   BREAST BIOPSY Right 06/21/2019   [place] bx, venus marker, fibroadenoma   BREAST CYST ASPIRATION Right 08/27/2015   neg   BREAST EXCISIONAL BIOPSY Right 2013   fibroadenoma   heel spurs     LAPAROSCOPIC BILATERAL SALPINGECTOMY Bilateral 02/26/2019   Procedure: LAPAROSCOPIC BILATERAL SALPINGECTOMY;  Surgeon: Benjaman Kindler, MD;  Location: [facility];  Service: Gynecology;  Laterality: Bilateral;   LAPAROSCOPIC HYSTERECTOMY N/A 02/26/2019   Procedure: HYSTERECTOMY TOTAL LAPAROSCOPIC;  Surgeon: Benjaman Kindler, MD;  Location: [facility];  Service: Gynecology;  Laterality: N/A;   NASAL SINUS SURGERY     TUBAL LIGATION     Patient Active Problem List   Diagnosis Date Noted   Menorrhagia with irregular cycle 02/26/2019   Iron deficiency anemia due to chronic blood loss 12/07/2018    PCP: Christella Noa, MD  REFERRING PROVIDER: Diamond Nickel, DO   REFERRING DIAG:  M53.3 (ICD-10-CM) - SI (sacroiliac) joint dysfunction   THERAPY DIAG:  Other low back pain  Other lack of coordination  Pelvic floor dysfunction  Rationale for Evaluation and Treatment: Rehabilitation  ONSET DATE: "For awhile now"  RED FLAGS: N/A Have you had any night sweats? Unexplained weight loss? Saddle anesthesia? Unexplained  changes in bowel or bladder habits?   SUBJECTIVE: Patient confirms identification and approves PT to assess pelvic floor and treatment Yes                                                                                                                                                                                           PRECAUTIONS: None  WEIGHT BEARING RESTRICTIONS: No  FALLS:  Has patient fallen in last 6 months? Yes. 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 Pt is having lower back pain with no N/T at the posterior leg or down to the feet. This pain started when her husband was sick and fell landing on her bottom. Pt found herself doing more household chores and caregiving for both husband/daughter. Pt is not sleeping through the night and finds herself sleeping more at times. Pt gets about 3 hrs of sleep then is up for awhile and then falls asleep for 3-4 hrs. Pt has had an episode of anterior tingling down the legs which is what initially sent her to Dr. Candelaria Stagers. That has since resolved.    PAIN:  Are you having pain? Yes NPRS scale: 8/10 (worst), 0/10 (best), 3/10 (current)  Pain location: lower back   Pain type: aching and sharp Pain description: intermittent, stabbing, tingling, and aching   Aggravating factors: lifting boxes, transfers, standing long periods  Relieving factors: rest, heat, ice, OTC   PATIENT GOALS: Pt will like to be able to lift things without hurting and be able to improve her BMs   UROLOGICAL HISTORY Will finish next session due to time constraints   Fluid intake: 24oz water or more, half sweet tea/unsweet  Pain with urination: No Fully empty bladder:  Stream:  Urgency:  Frequency:  Nocturia:  Leakage: Lifting, jumping on trampoline, bending  Pads:  Type:  Amount:  Bladder control (0-10):  GASTROINTESTINAL HISTORY Pain with bowel movement: Yes Type of bowel movement:Type (Bristol Stool Scale) 1 and Strain Yes, since a child  Frequency: Dulcolax every few days, and will have BM but if doesn't then can't have a BM Fully empty rectum: No     SEXUAL HISTORY/FUNCTION Pt has no concerns Pain with intercourse:  Ability to have vaginal penetration:  ; Deep thrusting:  Able to achieve orgasm?:   OBSTETRICAL HISTORY Vaginal deliveries: G3P3, trauma to tailbone with one Tearing:  C-section deliveries:  Currently pregnant:   GYNECOLOGICAL HISTORY Hysterectomy: yes/no, Vaginal/Abdominal Pelvic Organ Prolapse:  Pain with exam: yes no Heaviness/pressure: yes no   OBJECTIVE:   COGNITION: Overall cognitive status: Within functional limits for tasks assessed     POSTURE:  Grossly crossed legs in sitting with observed R shoulder elevation Lumbar lordosis:   Thoracic kyphosis: Deferred 2/2 time constraints  Iliac crest height:  Lumbar lateral shift:  Pelvic obliquity:  Leg length discrepancy:   GAIT: Deferred 2/2 time constraints  Distance walked:  Assistive device utilized:  Level of assistance:  Comments:   Trendelenburg:   SENSATION: Deferred 2/2 time constraints  Light touch: , L2-S2 dermatomes  Proprioception:    RANGE OF MOTION:  Deferred 2/2 time constraints   (Norm range in degrees)  LEFT  RIGHT   Lumbar forward flexion (65):      Lumbar extension (30):     Lumbar lateral flexion (25):     Thoracic and Lumbar rotation (30 degrees):       Hip Flexion (0-125):      Hip IR (0-45):     Hip ER (0-45):     Hip Adduction:      Hip Abduction (0-40):     Hip extension (0-15):     (*=  pain, Blank rows =  not tested)   STRENGTH: MMT  Deferred 2/2 time constraints   RLE  LLE   Hip Flexion    Hip Extension    Hip Abduction     Hip Adduction     Hip ER     Hip IR     Knee Extension    Knee Flexion    Dorsiflexion     Plantarflexion (seated)    (*= pain, Blank rows = not tested)   SPECIAL TESTS: Deferred 2/2 time constraints  Centralization and Peripheralization (SN 92, -LR 0.12):  Slump (SN 83, -LR 0.32):  SLR (SN 92, -LR 0.29): R: Lumbar quadrant (SN 70): R:  FABER (SN 81): FADIR (SN 94):  Hip scour (SN 50):  Thigh Thrust (SN 88, -LR 0.18) : Distraction [id number]):  Compression (SN/SP 69): Stork/March (SP 93):   PALPATION: Deferred 2/2 time constraints  Abdominal:  Diastasis:  finger above umbilicus,  fingers at and below umbilicus  Scar mobility: present/mobile perpendicular, parallel Rib flare: present/absent  EXTERNAL PELVIC EXAM: Patient educated on the purpose of the pelvic exam and articulated understanding; patient consented to the exam verbally. Deferred 2/2 time constraints  Palpation: Breath coordination: present/absent/inconsistent Voluntary Contraction: present/absent Relaxation: full/delayed/non-relaxing Perineal movement with sustained IAP increase ("bear down"): descent/no change/elevation/excessive descent Perineal movement with rapid IAP increase ("cough"): elevation/no change/descent Pubic symphysis: (0= no contraction, 1= flicker, 2= weak squeeze, 3= fair squeeze with lift, 4= good squeeze and lift against resistance, 5= strong squeeze against strong resistance)   INTERNAL PELVIC EXAM: Patient educated on the purpose of the pelvic exam and articulated understanding; patient consented to the exam verbally. Deferred 2/2 to time constraints Introitus Appears:  Skin integrity:  Scar mobility: Strength (PERF):  Symmetry: Palpation: Prolapse: (0= no contraction, 1= flicker, 2= weak squeeze, 3= fair squeeze with lift, 4= good squeeze and lift against  resistance, 5= strong squeeze against strong resistance)    Patient Education:  Patient educated on what to expect during course of physical therapy, POC, and provided with HEP including: toileting posture handout with diaphragmatic breathing in sitting. Patient verbalized understanding and returned demonstration. Patient will benefit from further education in order to maximize compliance and understanding for long-term therapeutic gains.  Patient Surveys:  FOTO Lumbar Spine - 37  FOTO Pelvic - will assess next visit     ASSESSMENT:  Clinical Impression: Patient is a 46 y.o. who was seen today for physical therapy evaluation and treatment for a chief concern of lower back pain and pelvic floor dysfunction. Today's evaluation suggest deficits in IAP management, activity tolerance, PFM coordination, PFM extensibility, PFM strength, posture, and pain as evidenced by increased R shoulder elevation in sitting with B crossed legs (increases PFM tension), worst intermittent low back pain, 10/10 (NPRS) described as an ache but at times can be sharp, low back pain aggravated by lifting/bending/pushing/pulling/standing long periods, multiple falls within the past 6 months (3 - due to imbalance while lifting daughter's wheelchair), urinary leakage with lifting/jumping/bending, straining to have a BM that on average is a Type 1 (Bristol Stool Chart), hx of hemorrhoids, and trauma to tailbone during childbirth. Patient's responses on FOTO Lumbar Spine (37) indicates significant limitation/disability/distress. 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 Pt with basic understanding of PFM function in bowel/bladder habits, posture, and pain. Patient will benefit from skilled therapeutic intervention to address deficits in IAP management, activity tolerance, PFM coordination, PFM  extensibility, PFM strength, posture, and pain in  order to increase PLOF and improve overall QOL.    Objective Impairments: decreased activity tolerance, decreased coordination, decreased strength, increased muscle spasms, improper body mechanics, postural dysfunction, and pain.   Activity Limitations: carrying, lifting, bending, standing, squatting, sleeping, continence, toileting, and caring for others  Personal Factors: Age, Past/current experiences, Time since onset of injury/illness/exacerbation, and 3+ comorbidities: anxiety/depression, insomnia, scoliosis  are also affecting patient's functional outcome.   Rehab Potential: Good  Clinical Decision Making: Unstable/unpredictable  Evaluation Complexity: High   GOALS: Goals reviewed with patient? Yes  SHORT TERM GOALS: Target date: 12/23/2021  Patient will demonstrate independence with HEP in order to maximize therapeutic gains and improve carryover from physical therapy sessions to ADLs in the home and community. Baseline: toileting posture handout with demo of breathing Goal status: INITIAL   LONG TERM GOALS: Target date: 01/27/2022   Patient will decrease worst pain as reported on NPRS by at least 2 points to demonstrate clinically significant reduction in pain in order to restore/improve function and overall QOL. Baseline: 8/10 low back (ache/at times sharp) Goal status: INITIAL  2.  Patient will demonstrate coordinated lengthening and relaxation of PFM with diaphragmatic inhalation in order to decrease spasm and allow for unrestricted elimination of urine/feces for improved overall QOL. Baseline: straining most of the time for BM (Type 1) and with Valsalva Goal status: INITIAL  3.  Patient will report being able to return to activities including, but not limited to: lifting, bending, squatting, pushing/pulling, increased physical activity, standing for long periods without pain or limitation to indicate complete resolution of the chief concern and return to prior level of  participation at home and in the community. Baseline: LBP is increased by all the above and feels like she strained it  Goal status: INITIAL  4.  Patient will improve FOTO Lumbar Spine and FOTO Pelvic score by at least 8 points  in order to demonstrate decreased pain, improved PFM coordination, improved IAP management, and improved overall QOL.  Baseline: Lumbar -37 (47-goal), will assess pelvic next time Goal status: INITIAL  5.  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  Baseline: will practice in future sessions Goal status: INITIAL  6. Patient will report less than 5 incidents of stress urinary incontinence over the course of 3 weeks while coughing/lifting/bending/laughing/jumping in order to demonstrate improved PFM coordination, strength, and function for improved overall QOL. Baseline: urinary leakage with all the above and avoids jumping Goal status: INITIAL    PLAN: PT Frequency: 1x/week  PT Duration: 10 weeks  Planned Interventions: Therapeutic exercises, Therapeutic activity, Neuromuscular re-education, Balance training, Gait training, Patient/Family education, Self Care, Joint mobilization, Spinal mobilization, Cryotherapy, Moist heat, scar mobilization, Taping, and Manual therapy  Plan For Next Session: phy assess, did you try toileting? Finish any questions from above    [employer], PT, DPT  11/18/2021, 3:19 PM

## 2021-11-25 ENCOUNTER — Ambulatory Visit: Payer: Medicaid Other

## 2021-12-01 ENCOUNTER — Ambulatory Visit: Payer: Medicaid Other

## 2021-12-02 ENCOUNTER — Ambulatory Visit: Payer: Medicaid Other

## 2021-12-09 ENCOUNTER — Ambulatory Visit: Payer: Medicaid Other | Attending: Sports Medicine

## 2021-12-09 DIAGNOSIS — R278 Other lack of coordination: Secondary | ICD-10-CM | POA: Insufficient documentation

## 2021-12-09 DIAGNOSIS — M5459 Other low back pain: Secondary | ICD-10-CM | POA: Insufficient documentation

## 2021-12-09 DIAGNOSIS — M6289 Other specified disorders of muscle: Secondary | ICD-10-CM | POA: Insufficient documentation

## 2021-12-09 NOTE — Therapy (Signed)
OUTPATIENT PHYSICAL THERAPY FEMALE PELVIC TREATMENT   Patient Name: Chelsea Carter MRN: [id number] DOB:Jun 21, 1975, 46 y.o., female Today's Date: 12/09/2021   PT End of Session - 12/09/21 1409     Visit Number 2    Number of Visits 10    Date for PT Re-Evaluation 01/27/22    Authorization Type IE: 11/18/21    PT Start Time 1410    PT Stop Time 1440    PT Time Calculation (min) 30 min    Activity Tolerance Patient tolerated treatment well             Past Medical History:  Diagnosis Date   Anemia    Anxiety    Complication of anesthesia    did not get complete effects from spinal   Depression    Insomnia    Past Surgical History:  Procedure Laterality Date   BREAST BIOPSY Right 06/21/2019   [place] bx, venus marker, fibroadenoma   BREAST CYST ASPIRATION Right 08/27/2015   neg   BREAST EXCISIONAL BIOPSY Right 2013   fibroadenoma   heel spurs     LAPAROSCOPIC BILATERAL SALPINGECTOMY Bilateral 02/26/2019   Procedure: LAPAROSCOPIC BILATERAL SALPINGECTOMY;  Surgeon: Benjaman Kindler, MD;  Location: [facility];  Service: Gynecology;  Laterality: Bilateral;   LAPAROSCOPIC HYSTERECTOMY N/A 02/26/2019   Procedure: HYSTERECTOMY TOTAL LAPAROSCOPIC;  Surgeon: Benjaman Kindler, MD;  Location: [facility];  Service: Gynecology;  Laterality: N/A;   NASAL SINUS SURGERY     TUBAL LIGATION     Patient Active Problem List   Diagnosis Date Noted   Menorrhagia with irregular cycle 02/26/2019   Iron deficiency anemia due to chronic blood loss 12/07/2018    PCP: Christella Noa, MD  REFERRING PROVIDER: Diamond Nickel, DO   REFERRING DIAG:  M53.3 (ICD-10-CM) - SI (sacroiliac) joint dysfunction   THERAPY DIAG:  Other low back pain  Other lack of coordination  Pelvic floor dysfunction  Rationale for Evaluation and Treatment: Rehabilitation  ONSET DATE: "For awhile now"  PRECAUTIONS: None  WEIGHT BEARING RESTRICTIONS: No  FALLS:  Has patient fallen in last 6 months? Yes.  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 Pt is having lower back pain with no N/T at the posterior leg or down to the feet. This pain started when her husband was sick and fell landing on her bottom. Pt found herself doing more household chores and caregiving for both husband/daughter. Pt is not sleeping through the night and finds herself sleeping more at times. Pt gets about 3 hrs of sleep then is up for awhile and then falls asleep for 3-4 hrs. Pt has had an episode of anterior tingling down the legs which is what initially sent her to Dr. Candelaria Stagers. That has since resolved.   Pain location: lower back   Pain type: aching and sharp Pain description: intermittent, stabbing, tingling, and aching   Aggravating factors: lifting boxes, transfers, standing long periods  Relieving factors: rest, heat, ice, OTC   PATIENT GOALS: Pt will like to be able to lift things without hurting and be able to improve her BMs   UROLOGICAL HISTORY Will finish next session due to time constraints  Fluid intake: 24oz water or more, half sweet tea/unsweet  Pain with urination: No Fully empty bladder:  Stream:  Urgency:  Frequency:  Nocturia:  Leakage: Lifting, jumping on trampoline, bending  Pads:  Type:  Amount:  Bladder control (0-10):  GASTROINTESTINAL HISTORY Pain with bowel movement: Yes Type of bowel movement:Type (Bristol Stool Scale) 1 and Strain Yes, since a child  Frequency: Dulcolax every few days, and will have BM but if doesn't then can't have a BM Fully empty rectum: No     SEXUAL HISTORY/FUNCTION Pt has no concerns Pain with intercourse:  Ability to have vaginal  penetration:  ; Deep thrusting:  Able to achieve orgasm?:   OBSTETRICAL HISTORY Vaginal deliveries: G3P3, trauma to tailbone with one Tearing:  C-section deliveries:  Currently pregnant:   GYNECOLOGICAL HISTORY Hysterectomy: yes/no, Vaginal/Abdominal Pelvic Organ Prolapse:  Pain with exam: yes no Heaviness/pressure: yes no  SUBJECTIVE:  Patient has been having a lot of external stressors lately but after discussion and talking, Pt would like to continue PFPT in order to begin prioritizing her health. Pt had noticed increased constipation while traveling and has been nauseous lately. Pt was taking Dramamine ginger chews.                                                                                                                                                                      PAIN:  Are you having pain? Yes NPRS scale: 8/10 (worst), 0/10 (best), 3/10 (current)    TODAY'S TREATMENT  Neuromuscular Re-education:  Pre-treatment assessment:  OBJECTIVE:   COGNITION: Overall cognitive status: Within functional limits for tasks assessed     POSTURE:  Grossly crossed legs in sitting with observed R shoulder elevation Lumbar lordosis:   Thoracic kyphosis: Deferred 2/2 time constraints  Iliac crest height: WNL Lumbar lateral shift:  Pelvic obliquity: WNL Leg length discrepancy:   + Adams test - R rib hump and L thoracic rotation increased pain   GAIT: Deferred 2/2 time constraints  Distance walked:  Assistive device utilized:  Level of assistance:  Comments:   Trendelenburg:   SENSATION: Deferred 2/2 time constraints  Light touch: , L2-S2 dermatomes  Proprioception:    RANGE OF MOTION:    (Norm range in degrees)  LEFT 12/09/21 RIGHT 12/09/21  Lumbar forward flexion (65):  WNL    Lumbar extension (30): WNL    Lumbar lateral flexion (25):  Restricted WNL  Thoracic and Lumbar rotation (30 degrees):       Hip Flexion (0-125):      Hip IR (0-45):     Hip ER  (0-45):     Hip Adduction:      Hip Abduction (0-40):     Hip extension (0-15):     (*=  pain, Blank rows = not tested)   STRENGTH: MMT  Deferred 2/2 time constraints   RLE  LLE   Hip Flexion    Hip Extension    Hip Abduction     Hip Adduction     Hip ER     Hip IR     Knee Extension    Knee Flexion    Dorsiflexion     Plantarflexion (seated)    (*= pain, Blank rows = not tested)   SPECIAL TESTS: Deferred 2/2 time constraints  Centralization and Peripheralization (SN 92, -LR 0.12):  Slump (SN 83, -LR 0.32):  SLR (SN 92, -LR 0.29): R: Lumbar quadrant (SN 70): R:  FABER (SN 81): FADIR (SN 94):  Hip scour (SN 50):  Thigh Thrust (SN 88, -LR 0.18) : Distraction [id number]):  Compression (SN/SP 69): Stork/March (SP 93):   PALPATION: Deferred 2/2 time constraints  Abdominal:  Diastasis:  finger above umbilicus,  fingers at and below umbilicus  Scar mobility: present/mobile perpendicular, parallel Rib flare: present/absent  EXTERNAL PELVIC EXAM: Patient educated on the purpose of the pelvic exam and articulated understanding; patient consented to the exam verbally. Deferred 2/2 time constraints  Palpation: Breath coordination: present/absent/inconsistent Voluntary Contraction: present/absent Relaxation: full/delayed/non-relaxing Perineal movement with sustained IAP increase ("bear down"): descent/no change/elevation/excessive descent Perineal movement with rapid IAP increase ("cough"): elevation/no change/descent Pubic symphysis: (0= no contraction, 1= flicker, 2= weak squeeze, 3= fair squeeze with lift, 4= good squeeze and lift against resistance, 5= strong squeeze against strong resistance)   INTERNAL PELVIC EXAM: Patient educated on the purpose of the pelvic exam and articulated understanding; patient consented to the exam verbally. Deferred 2/2 to time constraints Introitus Appears:  Skin integrity:  Scar mobility: Strength (PERF):   Symmetry: Palpation: Prolapse: (0= no contraction, 1= flicker, 2= weak squeeze, 3= fair squeeze with lift, 4= good squeeze and lift against resistance, 5= strong squeeze against strong resistance)    Patient Education:  Patient educated on what to expect during course of physical therapy, POC, and provided with HEP including: toileting posture handout with diaphragmatic breathing in sitting. Patient verbalized understanding and returned demonstration. Patient will benefit from further education in order to maximize compliance and understanding for long-term therapeutic gains.  Patient Surveys:  FOTO Lumbar Spine - 37  FOTO Pelvic - will assess next visit     ASSESSMENT:  Clinical Impression: Patient is a 46 y.o. who was seen today for physical therapy evaluation and treatment for a chief concern of lower back pain and pelvic floor dysfunction. Today's evaluation suggest deficits in IAP management, activity tolerance, PFM coordination, PFM extensibility, PFM strength, posture, and pain as evidenced by increased R shoulder elevation in sitting with B crossed legs (increases PFM tension), worst intermittent low back pain, 10/10 (NPRS) described as an ache but at times can be sharp, low back pain aggravated by lifting/bending/pushing/pulling/standing long periods, multiple falls within the past 6 months (3 - due to imbalance while lifting daughter's wheelchair), urinary leakage with lifting/jumping/bending, straining to have a BM that on average is a Type 1 (Bristol Stool Chart), hx of hemorrhoids, and trauma to tailbone during childbirth. Patient's responses on FOTO Lumbar Spine (37) indicates significant limitation/disability/distress. 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 Pt with basic understanding of PFM function in bowel/bladder habits, posture, and pain. Patient will benefit from skilled  therapeutic intervention to address deficits in IAP management, activity tolerance, PFM coordination, PFM  extensibility, PFM strength, posture, and pain in order to increase PLOF and improve overall QOL.    Objective Impairments: decreased activity tolerance, decreased coordination, decreased strength, increased muscle spasms, improper body mechanics, postural dysfunction, and pain.   Activity Limitations: carrying, lifting, bending, standing, squatting, sleeping, continence, toileting, and caring for others  Personal Factors: Age, Past/current experiences, Time since onset of injury/illness/exacerbation, and 3+ comorbidities: anxiety/depression, insomnia, scoliosis  are also affecting patient's functional outcome.   Rehab Potential: Good  Clinical Decision Making: Unstable/unpredictable  Evaluation Complexity: High   GOALS: Goals reviewed with patient? Yes  SHORT TERM GOALS: Target date: 12/23/2021  Patient will demonstrate independence with HEP in order to maximize therapeutic gains and improve carryover from physical therapy sessions to ADLs in the home and community. Baseline: toileting posture handout with demo of breathing Goal status: INITIAL   LONG TERM GOALS: Target date: 01/27/2022   Patient will decrease worst pain as reported on NPRS by at least 2 points to demonstrate clinically significant reduction in pain in order to restore/improve function and overall QOL. Baseline: 8/10 low back (ache/at times sharp) Goal status: INITIAL  2.  Patient will demonstrate coordinated lengthening and relaxation of PFM with diaphragmatic inhalation in order to decrease spasm and allow for unrestricted elimination of urine/feces for improved overall QOL. Baseline: straining most of the time for BM (Type 1) and with Valsalva Goal status: INITIAL  3.  Patient will report being able to return to activities including, but not limited to: lifting, bending, squatting, pushing/pulling,  increased physical activity, standing for long periods without pain or limitation to indicate complete resolution of the chief concern and return to prior level of participation at home and in the community. Baseline: LBP is increased by all the above and feels like she strained it  Goal status: INITIAL  4.  Patient will improve FOTO Lumbar Spine and FOTO Pelvic score by at least 8 points  in order to demonstrate decreased pain, improved PFM coordination, improved IAP management, and improved overall QOL.  Baseline: Lumbar -37 (47-goal), will assess pelvic next time Goal status: INITIAL  5.  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  Baseline: will practice in future sessions Goal status: INITIAL  6. Patient will report less than 5 incidents of stress urinary incontinence over the course of 3 weeks while coughing/lifting/bending/laughing/jumping in order to demonstrate improved PFM coordination, strength, and function for improved overall QOL. Baseline: urinary leakage with all the above and avoids jumping Goal status: INITIAL    PLAN: PT Frequency: 1x/week  PT Duration: 10 weeks  Planned Interventions: Therapeutic exercises, Therapeutic activity, Neuromuscular re-education, Balance training, Gait training, Patient/Family education, Self Care, Joint mobilization, Spinal mobilization, Cryotherapy, Moist heat, scar mobilization, Taping, and Manual therapy  Plan For Next Session: phy assess, did you try toileting? Finish any questions from above    [employer], PT, DPT  12/09/2021, 2:16 PM

## 2021-12-17 ENCOUNTER — Ambulatory Visit: Payer: Medicaid Other

## 2021-12-17 DIAGNOSIS — R278 Other lack of coordination: Secondary | ICD-10-CM

## 2021-12-17 DIAGNOSIS — M6289 Other specified disorders of muscle: Secondary | ICD-10-CM

## 2021-12-17 DIAGNOSIS — M5459 Other low back pain: Secondary | ICD-10-CM | POA: Diagnosis not present

## 2021-12-17 NOTE — Therapy (Signed)
OUTPATIENT PHYSICAL THERAPY FEMALE PELVIC TREATMENT   Patient Name: Chelsea Carter MRN: [id number] DOB:Jun 30, 1975, 46 y.o., female Today's Date: 12/17/2021   PT End of Session - 12/17/21 1401     Visit Number 3    Number of Visits 10    Date for PT Re-Evaluation 01/27/22    Authorization Type IE: 11/18/21    PT Start Time 1402    PT Stop Time 1440    PT Time Calculation (min) 38 min    Activity Tolerance Patient tolerated treatment well             Past Medical History:  Diagnosis Date   Anemia    Anxiety    Complication of anesthesia    did not get complete effects from spinal   Depression    Insomnia    Past Surgical History:  Procedure Laterality Date   BREAST BIOPSY Right 06/21/2019   [place] bx, venus marker, fibroadenoma   BREAST CYST ASPIRATION Right 08/27/2015   neg   BREAST EXCISIONAL BIOPSY Right 2013   fibroadenoma   heel spurs     LAPAROSCOPIC BILATERAL SALPINGECTOMY Bilateral 02/26/2019   Procedure: LAPAROSCOPIC BILATERAL SALPINGECTOMY;  Surgeon: Christeen Douglas, MD;  Location: [facility];  Service: Gynecology;  Laterality: Bilateral;   LAPAROSCOPIC HYSTERECTOMY N/A 02/26/2019   Procedure: HYSTERECTOMY TOTAL LAPAROSCOPIC;  Surgeon: Christeen Douglas, MD;  Location: [facility];  Service: Gynecology;  Laterality: N/A;   NASAL SINUS SURGERY     TUBAL LIGATION     Patient Active Problem List   Diagnosis Date Noted   Menorrhagia with irregular cycle 02/26/2019   Iron deficiency anemia due to chronic blood loss 12/07/2018    PCP: Silvestre Moment, MD  REFERRING PROVIDER: Jerrel Ivory, DO   REFERRING DIAG:  M53.3 (ICD-10-CM) - SI (sacroiliac) joint dysfunction   THERAPY DIAG:  Other low back pain  Other lack of coordination  Pelvic floor dysfunction  Rationale for Evaluation and Treatment: Rehabilitation  ONSET DATE: "For awhile now"  PRECAUTIONS: None  WEIGHT BEARING RESTRICTIONS: No  FALLS:  Has patient fallen in last 6 months? Yes.  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 Pt is having lower back pain with no N/T at the posterior leg or down to the feet. This pain started when her husband was sick and fell landing on her bottom. Pt found herself doing more household chores and caregiving for both husband/daughter. Pt is not sleeping through the night and finds herself sleeping more at times. Pt gets about 3 hrs of sleep then is up for awhile and then falls asleep for 3-4 hrs. Pt has had an episode of anterior tingling down the legs which is what initially sent her to Dr. Landry Mellow. That has since resolved.   Pain location: lower back   Pain type: aching and sharp Pain description: intermittent, stabbing, tingling, and aching   Aggravating factors: lifting boxes, transfers, standing long periods  Relieving factors: rest, heat, ice, OTC   PATIENT GOALS: Pt will like to be able to lift things without hurting and be able to improve her BMs   UROLOGICAL HISTORY Will finish next session due to time constraints  Fluid intake: 24oz water or more, half sweet tea/unsweet  Pain with urination: No Fully empty bladder:  Stream:  Urgency:  Frequency:  Nocturia:  Leakage: Lifting, jumping on trampoline, bending  Pads:  Type:  Amount:  Bladder control (0-10):  GASTROINTESTINAL HISTORY Pain with bowel movement: Yes Type of bowel movement:Type (Bristol Stool Scale) 1 and Strain Yes, since a child  Frequency: Dulcolax every few days, and will have BM but if doesn't then can't have a BM Fully empty rectum: No     SEXUAL HISTORY/FUNCTION Pt has no concerns Pain with intercourse:  Ability to have vaginal  penetration:  ; Deep thrusting:  Able to achieve orgasm?:   OBSTETRICAL HISTORY Vaginal deliveries: G3P3, trauma to tailbone with one Tearing:  C-section deliveries:  Currently pregnant:   GYNECOLOGICAL HISTORY Hysterectomy: yes, laparoscopic  Pelvic Organ Prolapse: no Pain with exam: no Heaviness/pressure: no  SUBJECTIVE:  Patient has been practicing the breathing more in sitting. Pt finds she has to concentrate a lot but continues to practice. Pt had an episode of back pain yesterday and is currently constipated. Pt had to take two dulcolax.   PAIN:  Are you having pain? No NPRS scale: 0     OBJECTIVE:   COGNITION: Overall cognitive status: Within functional limits for tasks assessed     POSTURE:  Lumbar lordosis: WNL Iliac crest height: WNL Pelvic obliquity: WNL   + Adams test - R rib hump and L thoracic rotation increased pain    RANGE OF MOTION:    (Norm range in degrees)  LEFT 12/09/21 RIGHT 12/09/21  Lumbar forward flexion (65):  WNL    Lumbar extension (30): WNL    Lumbar lateral flexion (25):  Restricted WNL  Thoracic and Lumbar rotation (30 degrees):    WNL* WNL  Hip Flexion (0-125):   WNL WNL  Hip IR (0-45):  WNL WNL  Hip ER (0-45):  WNL WNL  Hip Adduction:      Hip Abduction (0-40):  WNL WNL  Hip extension (0-15):     (*= pain, Blank rows = not tested)   STRENGTH: MMT    RLE 12/17/21 LLE 12/17/21  Hip Flexion 5 4  Hip Extension    Hip Abduction  5 5  Hip Adduction     Hip ER  5 5  Hip IR  5 5  Knee Extension 5 5  Knee Flexion 5 4  Dorsiflexion     Plantarflexion (seated) 5 5  (*= pain, Blank rows = not tested)   SPECIAL TESTS:  Slump (SN 83, -LR 0.32): negative B FABER (SN 81): negative B FADIR (SN 94): negative B   PALPATION: Will continue to assess further next session Abdominal:  Diastasis:  finger above umbilicus,  fingers at and below umbilicus  Scar mobility: none  Rib flare: B SIJ Prone: L5 hypomobile and with  increased pain with PPVIMs assessment Prone: R SIJ hypomobole more superiorly with pain at ligamentous structures surrounding SIJ Prone: L SIJ WNLs but more tenderness/pain at ligamentous structures surrounding SIJ   Increased lumbar lordosis in sidelying   Abdominal  More tenderness at central abdomen and increased myofascial tension felt upon palpation, may be due to feeling constipated    EXTERNAL PELVIC EXAM: Patient educated on the purpose of the pelvic exam and articulated understanding; patient consented to the exam verbally. Deferred 2/2 time constraints  Palpation: Breath coordination: present/absent/inconsistent Voluntary Contraction: present/absent Relaxation: full/delayed/non-relaxing Perineal movement with sustained IAP increase ("bear down"): descent/no change/elevation/excessive descent  Perineal movement with rapid IAP increase ("cough"): elevation/no change/descent Pubic symphysis: (0= no contraction, 1= flicker, 2= weak squeeze, 3= fair squeeze with lift, 4= good squeeze and lift against resistance, 5= strong squeeze against strong resistance)   TODAY'S TREATMENT  Manual Therapy: Bowel abdominal massage to improve myofascial tension and aid in motility of inner organs to have successful BM  DPT demonstrated how to perform as part of HEP  Pt verbalized understanding  Neuromuscular Re-education: Pre-treatment assessment: MMT, ROM, abdominal, lumbar spine, SIJ palpation/movements - see above for deficits   Discussion and demonstration on log roll technique for improved IAP management and to decrease lower back pain  Supine hooklying diaphragmatic breathing with VCs and TCs for downregulation of the nervous system and improved management of IAP   Patient response to interventions: Pt able to feel increased use of chest musculature during seated breathing     Patient Education:  Patient provided with HEP including: bowel abdominal massage technique. Patient  educated throughout session on appropriate technique and form using multi-modal cueing, HEP, and activity modification. Patient will benefit from further education in order to maximize compliance and understanding for long-term therapeutic gains.   ASSESSMENT:  Clinical Impression: Patient presents to clinic with excellent motivation to participate in today's session. Upon further physical assessment, Pt demonstrates deficits in ROM, LE strength, posture, IAP management, and pain as evidenced by restricted lumbar lateral flexion to the L, pain with L thoracic rotation, increased lumbar lordosis in sidelying, + Adams test (R rib elevation), B rib flare, 4/5 MMT with L hip flexion/knee flexion, hypomobility at L5 with tenderness, hypomobility at R SIJ more superiorly with tenderness/pain at ligamentous structures B, and myofascial tension in center of abdomen upon palpation. Pt verbalized understanding of bowel mobilization technique as part of HEP while DPT demonstrated. After continued technique, able to feel decreased myofascial tension at the center of the abdomen. Pt responded well to manual, active, and educational interventions. Patient will benefit from skilled therapeutic intervention to address deficits in IAP management, PFM coordination, LE strength, ROM, PFM extensibility, PFM strength, posture, and pain in order to increase PLOF and improve overall QOL.    Objective Impairments: decreased activity tolerance, decreased coordination, decreased strength, increased muscle spasms, improper body mechanics, postural dysfunction, and pain.   Activity Limitations: carrying, lifting, bending, standing, squatting, sleeping, continence, toileting, and caring for others  Personal Factors: Age, Past/current experiences, Time since onset of injury/illness/exacerbation, and 3+ comorbidities: anxiety/depression, insomnia, scoliosis  are also affecting patient's functional outcome.   Rehab Potential:  Good  Clinical Decision Making: Unstable/unpredictable  Evaluation Complexity: High   GOALS: Goals reviewed with patient? Yes  SHORT TERM GOALS: Target date: 12/23/2021  Patient will demonstrate independence with HEP in order to maximize therapeutic gains and improve carryover from physical therapy sessions to ADLs in the home and community. Baseline: toileting posture handout with demo of breathing Goal status: INITIAL   LONG TERM GOALS: Target date: 01/27/2022   Patient will decrease worst pain as reported on NPRS by at least 2 points to demonstrate clinically significant reduction in pain in order to restore/improve function and overall QOL. Baseline: 8/10 low back (ache/at times sharp) Goal status: INITIAL  2.  Patient will demonstrate coordinated lengthening and relaxation of PFM with diaphragmatic inhalation in order to decrease spasm and allow for unrestricted elimination of urine/feces for improved overall QOL. Baseline: straining most of the time for BM (Type 1) and with Valsalva Goal status: INITIAL  3.  Patient will report  being able to return to activities including, but not limited to: lifting, bending, squatting, pushing/pulling, increased physical activity, standing for long periods without pain or limitation to indicate complete resolution of the chief concern and return to prior level of participation at home and in the community. Baseline: LBP is increased by all the above and feels like she strained it  Goal status: INITIAL  4.  Patient will improve FOTO Lumbar Spine and FOTO Pelvic score by at least 8 points  in order to demonstrate decreased pain, improved PFM coordination, improved IAP management, and improved overall QOL.  Baseline: Lumbar -37 (47-goal), will assess pelvic next time Goal status: INITIAL  5.  Patient will be able to verbalize, to DPT and daughters, and demonstrate proper body mechanics when lifting heavy objects or while practicing  amb/transfers with her daughter whom she is a caregiver for in order to indicate improve IAP management, posture, and decreased pain.  Baseline: will practice in future sessions Goal status: INITIAL  6. Patient will report less than 5 incidents of stress urinary incontinence over the course of 3 weeks while coughing/lifting/bending/laughing/jumping in order to demonstrate improved PFM coordination, strength, and function for improved overall QOL. Baseline: urinary leakage with all the above and avoids jumping Goal status: INITIAL    PLAN: PT Frequency: 1x/week  PT Duration: 10 weeks  Planned Interventions: Therapeutic exercises, Therapeutic activity, Neuromuscular re-education, Balance training, Gait training, Patient/Family education, Self Care, Joint mobilization, Spinal mobilization, Cryotherapy, Moist heat, scar mobilization, Taping, and Manual therapy  Plan For Next Session:  finish phy assess, how was constipation this week/did you have BM?, more prone work or in sidelying at [employer] cow with IR, pelvic tilts    [employer], PT, DPT  12/17/2021, 2:55 PM

## 2021-12-24 ENCOUNTER — Ambulatory Visit: Payer: Medicaid Other

## 2021-12-30 ENCOUNTER — Ambulatory Visit: Payer: Medicaid Other

## 2021-12-30 DIAGNOSIS — M6289 Other specified disorders of muscle: Secondary | ICD-10-CM

## 2021-12-30 DIAGNOSIS — R278 Other lack of coordination: Secondary | ICD-10-CM

## 2021-12-30 DIAGNOSIS — M5459 Other low back pain: Secondary | ICD-10-CM | POA: Diagnosis not present

## 2021-12-30 NOTE — Therapy (Signed)
OUTPATIENT PHYSICAL THERAPY FEMALE PELVIC TREATMENT   Patient Name: Chelsea Carter MRN: [id number] DOB:[birth year], 46 y.o., female Today's Date: 12/30/2021   PT End of Session - 12/30/21 1404     Visit Number 4    Number of Visits 10    Date for PT Re-Evaluation 01/27/22    Authorization Type IE: 11/18/21    PT Start Time 1405    PT Stop Time 1445    PT Time Calculation (min) 40 min    Activity Tolerance Patient tolerated treatment well             Past Medical History:  Diagnosis Date   Anemia    Anxiety    Complication of anesthesia    did not get complete effects from spinal   Depression    Insomnia    Past Surgical History:  Procedure Laterality Date   BREAST BIOPSY Right 06/21/2019   us bx, venus marker, fibroadenoma   BREAST CYST ASPIRATION Right 08/27/2015   neg   BREAST EXCISIONAL BIOPSY Right 2013   fibroadenoma   heel spurs     LAPAROSCOPIC BILATERAL SALPINGECTOMY Bilateral 02/26/2019   Procedure: LAPAROSCOPIC BILATERAL SALPINGECTOMY;  Surgeon: Christeen DouglasBeasley, Bethany, MD;  Location: [facility];  Service: Gynecology;  Laterality: Bilateral;   LAPAROSCOPIC HYSTERECTOMY N/A 02/26/2019   Procedure: HYSTERECTOMY TOTAL LAPAROSCOPIC;  Surgeon: Christeen DouglasBeasley, Bethany, MD;  Location: [facility];  Service: Gynecology;  Laterality: N/A;   NASAL SINUS SURGERY     TUBAL LIGATION     Patient Active Problem List   Diagnosis Date Noted   Menorrhagia with irregular cycle 02/26/2019   Iron deficiency anemia due to chronic blood loss 12/07/2018    PCP: Silvestre MomentStephen Myers, MD  REFERRING PROVIDER: Jerrel IvoryKubinski, Andrew J, DO   REFERRING DIAG:  M53.3 (ICD-10-CM) - SI (sacroiliac) joint dysfunction   THERAPY DIAG:  Other low back pain  Other lack of coordination  Pelvic floor dysfunction  Rationale for Evaluation and Treatment: Rehabilitation  ONSET DATE: "For awhile now"  PRECAUTIONS: None  WEIGHT BEARING RESTRICTIONS: No  FALLS:  Has patient fallen in last 6 months? Yes.  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 Pt is having lower back pain with no N/T at the posterior leg or down to the feet. This pain started when her husband was sick and fell landing on her bottom. Pt found herself doing more household chores and caregiving for both husband/daughter. Pt is not sleeping through the night and finds herself sleeping more at times. Pt gets about 3 hrs of sleep then is up for awhile and then falls asleep for 3-4 hrs. Pt has had an episode of anterior tingling down the legs which is what initially sent her to Dr. Landry MellowKubinski. That has since resolved.   Pain location: lower back   Pain type: aching and sharp Pain description: intermittent, stabbing, tingling, and aching   Aggravating factors: lifting boxes, transfers, standing long periods  Relieving factors: rest, heat, ice, OTC   PATIENT GOALS: Pt will like to be able to lift things without hurting and be able to improve her BMs   UROLOGICAL HISTORY Will finish next session due to time constraints  Fluid intake: 24oz water or more, half sweet tea/unsweet  Pain with urination: No Fully empty bladder:  Stream:  Urgency:  Frequency:  Nocturia:  Leakage: Lifting, jumping on trampoline, bending  Pads:  Type:  Amount:  Bladder control (0-10):  GASTROINTESTINAL HISTORY Pain with bowel movement: Yes Type of bowel movement:Type (Bristol Stool Scale) 1 and Strain Yes, since a child  Frequency: Dulcolax every few days, and will have BM but if doesn't then can't have a BM Fully empty rectum: No     SEXUAL HISTORY/FUNCTION Pt has no concerns Pain with intercourse:  Ability to have vaginal  penetration:  ; Deep thrusting:  Able to achieve orgasm?:   OBSTETRICAL HISTORY Vaginal deliveries: G3P3, trauma to tailbone with one Tearing:  C-section deliveries:  Currently pregnant:   GYNECOLOGICAL HISTORY Hysterectomy: yes, laparoscopic  Pelvic Organ Prolapse: no Pain with exam: no Heaviness/pressure: no  SUBJECTIVE:  Patient is having L sided upper SIJ pain that is described as sharp. Pt has noticed more lower back pain with vacuuming.   PAIN:  Are you having pain? Yes NPRS scale: L upper SIJ/lower back sharp, 5/10     OBJECTIVE:   COGNITION: Overall cognitive status: Within functional limits for tasks assessed     POSTURE:  Lumbar lordosis: WNL Iliac crest height: WNL Pelvic obliquity: WNL   + Adams test - R rib hump and L thoracic rotation increased pain    RANGE OF MOTION:    (Norm range in degrees)  LEFT 12/09/21 RIGHT 12/09/21  Lumbar forward flexion (65):  WNL    Lumbar extension (30): WNL    Lumbar lateral flexion (25):  Restricted WNL  Thoracic and Lumbar rotation (30 degrees):    WNL* WNL  Hip Flexion (0-125):   WNL WNL  Hip IR (0-45):  WNL WNL  Hip ER (0-45):  WNL WNL  Hip Adduction:      Hip Abduction (0-40):  WNL WNL  Hip extension (0-15):     (*= pain, Blank rows = not tested)   STRENGTH: MMT    RLE 12/17/21 LLE 12/17/21  Hip Flexion 5 4  Hip Extension    Hip Abduction  5 5  Hip Adduction     Hip ER  5 5  Hip IR  5 5  Knee Extension 5 5  Knee Flexion 5 4  Dorsiflexion     Plantarflexion (seated) 5 5  (*= pain, Blank rows = not tested)   SPECIAL TESTS:  Slump (SN 83, -LR 0.32): negative B FABER (SN 81): negative B FADIR (SN 94): negative B   PALPATION: Will continue to assess further next session Abdominal:  Diastasis:  finger above umbilicus,  fingers at and below umbilicus  Scar mobility: none  Rib flare: B SIJ Prone: L5 hypomobile and with increased pain with PPVIMs assessment Prone: R SIJ hypomobole more  superiorly with pain at ligamentous structures surrounding SIJ Prone: L SIJ WNLs but more tenderness/pain at ligamentous structures surrounding SIJ   Increased lumbar lordosis in sidelying   Abdominal  More tenderness at central abdomen and increased myofascial tension felt upon palpation, may be due to feeling constipated    EXTERNAL PELVIC EXAM: Patient educated on the purpose of the pelvic exam and articulated understanding; patient consented to the exam verbally. Deferred 2/2 time constraints  Palpation: Breath coordination: present/absent/inconsistent Voluntary Contraction: present/absent Relaxation: full/delayed/non-relaxing Perineal movement with sustained IAP increase ("bear down"): descent/no change/elevation/excessive descent Perineal movement with rapid IAP increase ("cough"): elevation/no change/descent Pubic symphysis: (0=  no contraction, 1= flicker, 2= weak squeeze, 3= fair squeeze with lift, 4= good squeeze and lift against resistance, 5= strong squeeze against strong resistance)   TODAY'S TREATMENT  Manual Therapy: Prone:  CPAs - L4-L5, L SIJ with more hypomobility superiorly at SIJ, improved with continued effort   STMs at L paraspinals for improved tissue extensibility and pain modulation   STMs at L SIJ ligamentous structures and gluteal musculature with hip IR movement for tissue extensibility and pain modulation   Neuromuscular Re-education: Supine hooklying diaphragmatic breathing with VCs and TCs for downregulation of the nervous system and improved management of IAP  Pain modulation techniques with heat modality, VCs and TCs required  Lateral trunk rotation  Single knee to chest  Cat/cow with hip IR and coordinated breath    Patient response to interventions: Pt ended session with 3/10 low back dull ache   Patient Education:  Patient provided with HEP including: pain modulation techniques above. Patient educated throughout session on appropriate  technique and form using multi-modal cueing, HEP, and activity modification. Patient will benefit from further education in order to maximize compliance and understanding for long-term therapeutic gains.   ASSESSMENT:  Clinical Impression: Patient presents to clinic with excellent motivation to participate in today's session. Pt continues to demonstrate deficits IAP management, PFM coordination, LE strength, ROM, PFM extensibility, PFM strength, posture, and pain. Pt with 5/10 sharp pain at L lumbar spine and upper SIJ at beginning of session. Upon palpation, Pt with significant tension at the L paraspinals more towards the iliac crest. Pt with increased pain initially, but with increased time and cueing for diaphragmatic breathing, Pt able to feel just tenderness. Pt tolerated hip IR movement well in prone with added TrP pressure. Pt required moderate VCs and TCs for pain modulation techniques while on heat modality for proper technique and to decrease bodily compensations (pushing into pain). Pt responded positively to manual, active, and educational interventions and ended sessoin with 3/10 dull ache. Patient will benefit from skilled therapeutic intervention to address deficits in IAP management, PFM coordination, LE strength, ROM, PFM extensibility, PFM strength, posture, and pain in order to increase PLOF and improve overall QOL.    Objective Impairments: decreased activity tolerance, decreased coordination, decreased strength, increased muscle spasms, improper body mechanics, postural dysfunction, and pain.   Activity Limitations: carrying, lifting, bending, standing, squatting, sleeping, continence, toileting, and caring for others  Personal Factors: Age, Past/current experiences, Time since onset of injury/illness/exacerbation, and 3+ comorbidities: anxiety/depression, insomnia, scoliosis  are also affecting patient's functional outcome.   Rehab Potential: Good  Clinical Decision Making:  Unstable/unpredictable  Evaluation Complexity: High   GOALS: Goals reviewed with patient? Yes  SHORT TERM GOALS: Target date: 12/23/2021  Patient will demonstrate independence with HEP in order to maximize therapeutic gains and improve carryover from physical therapy sessions to ADLs in the home and community. Baseline: toileting posture handout with demo of breathing Goal status: INITIAL   LONG TERM GOALS: Target date: 01/27/2022   Patient will decrease worst pain as reported on NPRS by at least 2 points to demonstrate clinically significant reduction in pain in order to restore/improve function and overall QOL. Baseline: 8/10 low back (ache/at times sharp) Goal status: INITIAL  2.  Patient will demonstrate coordinated lengthening and relaxation of PFM with diaphragmatic inhalation in order to decrease spasm and allow for unrestricted elimination of urine/feces for improved overall QOL. Baseline: straining most of the time for BM (Type 1) and with Valsalva Goal status: INITIAL  3.  Patient will report being able to return to activities including, but not limited to: lifting, bending, squatting, pushing/pulling, increased physical activity, standing for long periods without pain or limitation to indicate complete resolution of the chief concern and return to prior level of participation at home and in the community. Baseline: LBP is increased by all the above and feels like she strained it  Goal status: INITIAL  4.  Patient will improve FOTO Lumbar Spine and FOTO Pelvic score by at least 8 points  in order to demonstrate decreased pain, improved PFM coordination, improved IAP management, and improved overall QOL.  Baseline: Lumbar -37 (47-goal), will assess pelvic next time Goal status: INITIAL  5.  Patient will be able to verbalize, to DPT and daughters, and demonstrate proper body mechanics when lifting heavy objects or while practicing amb/transfers with her daughter whom she is a  caregiver for in order to indicate improve IAP management, posture, and decreased pain.  Baseline: will practice in future sessions Goal status: INITIAL  6. Patient will report less than 5 incidents of stress urinary incontinence over the course of 3 weeks while coughing/lifting/bending/laughing/jumping in order to demonstrate improved PFM coordination, strength, and function for improved overall QOL. Baseline: urinary leakage with all the above and avoids jumping Goal status: INITIAL    PLAN: PT Frequency: 1x/week  PT Duration: 10 weeks  Planned Interventions: Therapeutic exercises, Therapeutic activity, Neuromuscular re-education, Balance training, Gait training, Patient/Family education, Self Care, Joint mobilization, Spinal mobilization, Cryotherapy, Moist heat, scar mobilization, Taping, and Manual therapy  Plan For Next Session:  STSs/lifting techniques, PFM exam, more prone work or in sidelying at SIJ/childs pose/, pelvic tilts, begin deep core   [employer], PT, DPT  12/30/2021, 2:05 PM

## 2022-01-07 ENCOUNTER — Ambulatory Visit: Payer: Medicaid Other

## 2022-01-07 DIAGNOSIS — R278 Other lack of coordination: Secondary | ICD-10-CM

## 2022-01-07 DIAGNOSIS — M6289 Other specified disorders of muscle: Secondary | ICD-10-CM

## 2022-01-07 DIAGNOSIS — M5459 Other low back pain: Secondary | ICD-10-CM | POA: Diagnosis not present

## 2022-01-07 NOTE — Therapy (Signed)
OUTPATIENT PHYSICAL THERAPY FEMALE PELVIC TREATMENT   Patient Name: Chelsea Carter MRN: [id number] DOB:[birth year], 46 y.o., female Today's Date: 01/07/2022   PT End of Session - 01/07/22 1455     Visit Number 5    Number of Visits 10    Date for PT Re-Evaluation 01/27/22    Authorization Type IE: 11/18/21    PT Start Time 1454    PT Stop Time 1525    PT Time Calculation (min) 31 min    Activity Tolerance Patient tolerated treatment well             Past Medical History:  Diagnosis Date   Anemia    Anxiety    Complication of anesthesia    did not get complete effects from spinal   Depression    Insomnia    Past Surgical History:  Procedure Laterality Date   BREAST BIOPSY Right 06/21/2019   us bx, venus marker, fibroadenoma   BREAST CYST ASPIRATION Right 08/27/2015   neg   BREAST EXCISIONAL BIOPSY Right 2013   fibroadenoma   heel spurs     LAPAROSCOPIC BILATERAL SALPINGECTOMY Bilateral 02/26/2019   Procedure: LAPAROSCOPIC BILATERAL SALPINGECTOMY;  Surgeon: Christeen DouglasBeasley, Bethany, MD;  Location: [facility];  Service: Gynecology;  Laterality: Bilateral;   LAPAROSCOPIC HYSTERECTOMY N/A 02/26/2019   Procedure: HYSTERECTOMY TOTAL LAPAROSCOPIC;  Surgeon: Christeen DouglasBeasley, Bethany, MD;  Location: [facility];  Service: Gynecology;  Laterality: N/A;   NASAL SINUS SURGERY     TUBAL LIGATION     Patient Active Problem List   Diagnosis Date Noted   Menorrhagia with irregular cycle 02/26/2019   Iron deficiency anemia due to chronic blood loss 12/07/2018    PCP: Silvestre MomentStephen Myers, MD  REFERRING PROVIDER: Jerrel IvoryKubinski, Andrew J, DO   REFERRING DIAG:  M53.3 (ICD-10-CM) - SI (sacroiliac) joint dysfunction   THERAPY DIAG:  Other low back pain  Other lack of coordination  Pelvic floor dysfunction  Rationale for Evaluation and Treatment: Rehabilitation  ONSET DATE: "For awhile now"  PRECAUTIONS: None  WEIGHT BEARING RESTRICTIONS: No  FALLS:  Has patient fallen in last 6 months? Yes.  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 Pt is having lower back pain with no N/T at the posterior leg or down to the feet. This pain started when her husband was sick and fell landing on her bottom. Pt found herself doing more household chores and caregiving for both husband/daughter. Pt is not sleeping through the night and finds herself sleeping more at times. Pt gets about 3 hrs of sleep then is up for awhile and then falls asleep for 3-4 hrs. Pt has had an episode of anterior tingling down the legs which is what initially sent her to Dr. Landry MellowKubinski. That has since resolved.   Pain location: lower back   Pain type: aching and sharp Pain description: intermittent, stabbing, tingling, and aching   Aggravating factors: lifting boxes, transfers, standing long periods  Relieving factors: rest, heat, ice, OTC   PATIENT GOALS: Pt will like to be able to lift things without hurting and be able to improve her BMs   UROLOGICAL HISTORY Will finish next session due to time constraints  Fluid intake: 24oz water or more, half sweet tea/unsweet  Pain with urination: No Fully empty bladder:  Stream:  Urgency:  Frequency:  Nocturia:  Leakage: Lifting, jumping on trampoline, bending  Pads:  Type:  Amount:  Bladder control (0-10):  GASTROINTESTINAL HISTORY Pain with bowel movement: Yes Type of bowel movement:Type (Bristol Stool Scale) 1 and Strain Yes, since a child  Frequency: Dulcolax every few days, and will have BM but if doesn't then can't have a BM Fully empty rectum: No     SEXUAL HISTORY/FUNCTION Pt has no concerns Pain with intercourse:  Ability to have vaginal  penetration:  ; Deep thrusting:  Able to achieve orgasm?:   OBSTETRICAL HISTORY Vaginal deliveries: G3P3, trauma to tailbone with one Tearing:  C-section deliveries:  Currently pregnant:   GYNECOLOGICAL HISTORY Hysterectomy: yes, laparoscopic  Pelvic Organ Prolapse: no Pain with exam: no Heaviness/pressure: no  SUBJECTIVE:  Pt arrived 9 mins after scheduled time. Patient has been having BMs although with dulcolax and bowel abdominal massage has helped. Pt did have a fall on her buttocks but lightly on the ground as she was kneeling already. Pt has had more pain in the L gluteal area. Pt has been having to take pain medication every 8-12 hrs.   PAIN:  Are you having pain? No NPRS scale: 0/10   OBJECTIVE:   COGNITION: Overall cognitive status: Within functional limits for tasks assessed     POSTURE:  Lumbar lordosis: WNL Iliac crest height: WNL Pelvic obliquity: WNL   + Adams test - R rib hump and L thoracic rotation increased pain    RANGE OF MOTION:    (Norm range in degrees)  LEFT 12/09/21 RIGHT 12/09/21  Lumbar forward flexion (65):  WNL    Lumbar extension (30): WNL    Lumbar lateral flexion (25):  Restricted WNL  Thoracic and Lumbar rotation (30 degrees):    WNL* WNL  Hip Flexion (0-125):   WNL WNL  Hip IR (0-45):  WNL WNL  Hip ER (0-45):  WNL WNL  Hip Adduction:      Hip Abduction (0-40):  WNL WNL  Hip extension (0-15):     (*= pain, Blank rows = not tested)   STRENGTH: MMT    RLE 12/17/21 LLE 12/17/21  Hip Flexion 5 4  Hip Extension    Hip Abduction  5 5  Hip Adduction     Hip ER  5 5  Hip IR  5 5  Knee Extension 5 5  Knee Flexion 5 4  Dorsiflexion     Plantarflexion (seated) 5 5  (*= pain, Blank rows = not tested)   SPECIAL TESTS:  Slump (SN 83, -LR 0.32): negative B FABER (SN 81): negative B FADIR (SN 94): negative B   PALPATION: Will continue to assess further next session Abdominal:  Diastasis:  finger above umbilicus,  fingers at  and below umbilicus  Scar mobility: none  Rib flare: B SIJ Prone: L5 hypomobile and with increased pain with PPVIMs assessment Prone: R SIJ hypomobole more superiorly with pain at ligamentous structures surrounding SIJ Prone: L SIJ WNLs but more tenderness/pain at ligamentous structures surrounding SIJ   Increased lumbar lordosis in sidelying   Abdominal  More tenderness at central abdomen and increased myofascial tension felt upon palpation, may be due to feeling constipated    EXTERNAL PELVIC EXAM: Patient educated on the purpose of the pelvic exam and articulated understanding; patient consented to the exam verbally. Deferred 2/2 time constraints  Palpation:  Breath coordination: present/absent/inconsistent Voluntary Contraction: present/absent Relaxation: full/delayed/non-relaxing Perineal movement with sustained IAP increase ("bear down"): descent/no change/elevation/excessive descent Perineal movement with rapid IAP increase ("cough"): elevation/no change/descent Pubic symphysis: (0= no contraction, 1= flicker, 2= weak squeeze, 3= fair squeeze with lift, 4= good squeeze and lift against resistance, 5= strong squeeze against strong resistance)   TODAY'S TREATMENT  Manual Therapy: In R sidelying: STMs at L SIJ ligamentous structures and gluteal musculature for improved tissue extensibility and pain modulation  Initially sharp pain   Neuromuscular Re-education: Supine hooklying diaphragmatic breathing with VCs and TCs for downregulation of the nervous system and improved management of IAP  PFM lengthening techniques/Pain modulation techniques while on heat modality, VCs and TCs required  Supine piriformis pose   Single knee to chest  "Butterfly" pose "Happy baby" pose  Advised Pt to place heating pad to aid with soreness after PT sessions but if does not go away within 36 hours to let DPT know. Pt verbalized understanding.   Patient response to interventions: Pt with some  soreness at the L gluteal area but no pain    Patient Education:  Patient provided with HEP including: continued pain modulation techniques above. Patient educated throughout session on appropriate technique and form using multi-modal cueing, HEP, and activity modification. Patient will benefit from further education in order to maximize compliance and understanding for long-term therapeutic gains.   ASSESSMENT:  Clinical Impression: Patient presents to clinic with excellent motivation to participate in today's session. Pt continues to demonstrate deficits IAP management, PFM coordination, LE strength, ROM, PFM extensibility, PFM strength, posture, and pain. Pt had a recent low grade fall from a squat position onto the buttocks. Pt with no pain initially but a few days after began to have intense pain in the L gluteal area. Upon palpation, Pt with significant pain at L gluteal mm/piriformis/SIJ ligamentous structures but with continued manual intervention, the sharp pain eased to a dull pain. Pt required moderate VCs and TCs for pain modulation techniques/PFM lengthening techniques while on heat modality for proper technique and to decrease bodily compensations. Pt responded positively to manual, active, and educational interventions and ended sessoin with 3/10 dull ache. Patient will benefit from skilled therapeutic intervention to address deficits in IAP management, PFM coordination, LE strength, ROM, PFM extensibility, PFM strength, posture, and pain in order to increase PLOF and improve overall QOL.    Objective Impairments: decreased activity tolerance, decreased coordination, decreased strength, increased muscle spasms, improper body mechanics, postural dysfunction, and pain.   Activity Limitations: carrying, lifting, bending, standing, squatting, sleeping, continence, toileting, and caring for others  Personal Factors: Age, Past/current experiences, Time since onset of  injury/illness/exacerbation, and 3+ comorbidities: anxiety/depression, insomnia, scoliosis  are also affecting patient's functional outcome.   Rehab Potential: Good  Clinical Decision Making: Unstable/unpredictable  Evaluation Complexity: High   GOALS: Goals reviewed with patient? Yes  SHORT TERM GOALS: Target date: 12/23/2021  Patient will demonstrate independence with HEP in order to maximize therapeutic gains and improve carryover from physical therapy sessions to ADLs in the home and community. Baseline: toileting posture handout with demo of breathing Goal status: INITIAL   LONG TERM GOALS: Target date: 01/27/2022   Patient will decrease worst pain as reported on NPRS by at least 2 points to demonstrate clinically significant reduction in pain in order to restore/improve function and overall QOL. Baseline: 8/10 low back (ache/at times sharp) Goal status: INITIAL  2.  Patient will demonstrate coordinated lengthening and relaxation of PFM with diaphragmatic  inhalation in order to decrease spasm and allow for unrestricted elimination of urine/feces for improved overall QOL. Baseline: straining most of the time for BM (Type 1) and with Valsalva Goal status: INITIAL  3.  Patient will report being able to return to activities including, but not limited to: lifting, bending, squatting, pushing/pulling, increased physical activity, standing for long periods without pain or limitation to indicate complete resolution of the chief concern and return to prior level of participation at home and in the community. Baseline: LBP is increased by all the above and feels like she strained it  Goal status: INITIAL  4.  Patient will improve FOTO Lumbar Spine and FOTO Pelvic score by at least 8 points  in order to demonstrate decreased pain, improved PFM coordination, improved IAP management, and improved overall QOL.  Baseline: Lumbar -37 (47-goal), will assess pelvic next time Goal status:  INITIAL  5.  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  Baseline: will practice in future sessions Goal status: INITIAL  6. Patient will report less than 5 incidents of stress urinary incontinence over the course of 3 weeks while coughing/lifting/bending/laughing/jumping in order to demonstrate improved PFM coordination, strength, and function for improved overall QOL. Baseline: urinary leakage with all the above and avoids jumping Goal status: INITIAL    PLAN: PT Frequency: 1x/week  PT Duration: 10 weeks  Planned Interventions: Therapeutic exercises, Therapeutic activity, Neuromuscular re-education, Balance training, Gait training, Patient/Family education, Self Care, Joint mobilization, Spinal mobilization, Cryotherapy, Moist heat, scar mobilization, Taping, and Manual therapy  Plan For Next Session:  STSs/lifting techniques, (family session), PFM exam, pelvic tilts, begin deep core   Skylyn Slezak, PT, DPT  01/07/2022, 2:56 PM

## 2022-01-13 ENCOUNTER — Ambulatory Visit: Payer: Medicaid Other | Attending: Sports Medicine

## 2022-01-13 DIAGNOSIS — M6289 Other specified disorders of muscle: Secondary | ICD-10-CM | POA: Diagnosis present

## 2022-01-13 DIAGNOSIS — R278 Other lack of coordination: Secondary | ICD-10-CM

## 2022-01-13 DIAGNOSIS — M5459 Other low back pain: Secondary | ICD-10-CM

## 2022-01-13 NOTE — Therapy (Signed)
OUTPATIENT PHYSICAL THERAPY FEMALE PELVIC TREATMENT   Patient Name: Chelsea Carter MRN: [id number] DOB:29-Dec-1975, 46 y.o., female Today's Date: 01/13/2022   PT End of Session - 01/13/22 1106     Visit Number 6    Number of Visits 10    Date for PT Re-Evaluation 01/27/22    Authorization Type IE: 11/18/21    PT Start Time 1110    PT Stop Time 1155    PT Time Calculation (min) 45 min    Activity Tolerance Patient tolerated treatment well             Past Medical History:  Diagnosis Date   Anemia    Anxiety    Complication of anesthesia    did not get complete effects from spinal   Depression    Insomnia    Past Surgical History:  Procedure Laterality Date   BREAST BIOPSY Right 06/21/2019   [place] bx, venus marker, fibroadenoma   BREAST CYST ASPIRATION Right 08/27/2015   neg   BREAST EXCISIONAL BIOPSY Right 2013   fibroadenoma   heel spurs     LAPAROSCOPIC BILATERAL SALPINGECTOMY Bilateral 02/26/2019   Procedure: LAPAROSCOPIC BILATERAL SALPINGECTOMY;  Surgeon: Christeen Douglas, MD;  Location: [facility];  Service: Gynecology;  Laterality: Bilateral;   LAPAROSCOPIC HYSTERECTOMY N/A 02/26/2019   Procedure: HYSTERECTOMY TOTAL LAPAROSCOPIC;  Surgeon: Christeen Douglas, MD;  Location: [facility];  Service: Gynecology;  Laterality: N/A;   NASAL SINUS SURGERY     TUBAL LIGATION     Patient Active Problem List   Diagnosis Date Noted   Menorrhagia with irregular cycle 02/26/2019   Iron deficiency anemia due to chronic blood loss 12/07/2018    PCP: Silvestre Moment, MD  REFERRING PROVIDER: Jerrel Ivory, DO   REFERRING DIAG:  M53.3 (ICD-10-CM) - SI (sacroiliac) joint dysfunction   THERAPY DIAG:  Other low back pain  Other lack of coordination  Pelvic floor dysfunction  Rationale for Evaluation and Treatment: Rehabilitation  ONSET DATE: "For awhile now"  PRECAUTIONS: None  WEIGHT BEARING RESTRICTIONS: No  FALLS:  Has patient fallen in last 6 months? Yes.  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 Pt is having lower back pain with no N/T at the posterior leg or down to the feet. This pain started when her husband was sick and fell landing on her bottom. Pt found herself doing more household chores and caregiving for both husband/daughter. Pt is not sleeping through the night and finds herself sleeping more at times. Pt gets about 3 hrs of sleep then is up for awhile and then falls asleep for 3-4 hrs. Pt has had an episode of anterior tingling down the legs which is what initially sent her to Dr. Landry Mellow. That has since resolved.   Pain location: lower back   Pain type: aching and sharp Pain description: intermittent, stabbing, tingling, and aching   Aggravating factors: lifting boxes, transfers, standing long periods  Relieving factors: rest, heat, ice, OTC   PATIENT GOALS: Pt will like to be able to lift things without hurting and be able to improve her BMs   UROLOGICAL HISTORY Will finish next session due to time constraints  Fluid intake: 24oz water or more, half sweet tea/unsweet  Pain with urination: No Fully empty bladder:  Stream:  Urgency:  Frequency:  Nocturia:  Leakage: Lifting, jumping on trampoline, bending  Pads:  Type:  Amount:  Bladder control (0-10):  GASTROINTESTINAL HISTORY Pain with bowel movement: Yes Type of bowel movement:Type (Bristol Stool Scale) 1 and Strain Yes, since a child  Frequency: Dulcolax every few days, and will have BM but if doesn't then can't have a BM Fully empty rectum: No     SEXUAL HISTORY/FUNCTION Pt has no concerns Pain with intercourse:  Ability to have vaginal  penetration:  ; Deep thrusting:  Able to achieve orgasm?:   OBSTETRICAL HISTORY Vaginal deliveries: G3P3, trauma to tailbone with one Tearing:  C-section deliveries:  Currently pregnant:   GYNECOLOGICAL HISTORY Hysterectomy: yes, laparoscopic  Pelvic Organ Prolapse: no Pain with exam: no Heaviness/pressure: no  SUBJECTIVE:  Patient has been having increased pain at the bottom bilaterally. Pt feels the sharp pain at B gluteal muscles but more on the L. Pt does not have a problem sitting but feels it more while walking. Pt has been trying IcyHot and taking meloxicam.   PAIN:  Are you having pain? Yes NPRS scale: 6/10    OBJECTIVE:   COGNITION: Overall cognitive status: Within functional limits for tasks assessed     POSTURE:  Lumbar lordosis: WNL Iliac crest height: WNL Pelvic obliquity: WNL   + Adams test - R rib hump and L thoracic rotation increased pain    RANGE OF MOTION:    (Norm range in degrees)  LEFT 12/09/21 RIGHT 12/09/21  Lumbar forward flexion (65):  WNL    Lumbar extension (30): WNL    Lumbar lateral flexion (25):  Restricted WNL  Thoracic and Lumbar rotation (30 degrees):    WNL* WNL  Hip Flexion (0-125):   WNL WNL  Hip IR (0-45):  WNL WNL  Hip ER (0-45):  WNL WNL  Hip Adduction:      Hip Abduction (0-40):  WNL WNL  Hip extension (0-15):     (*= pain, Blank rows = not tested)   STRENGTH: MMT    RLE 12/17/21 LLE 12/17/21  Hip Flexion 5 4  Hip Extension    Hip Abduction  5 5  Hip Adduction     Hip ER  5 5  Hip IR  5 5  Knee Extension 5 5  Knee Flexion 5 4  Dorsiflexion     Plantarflexion (seated) 5 5  (*= pain, Blank rows = not tested)   SPECIAL TESTS:  Slump (SN 83, -LR 0.32): negative B FABER (SN 81): negative B FADIR (SN 94): negative B   PALPATION: Will continue to assess further next session Abdominal:  Diastasis:  finger above umbilicus,  fingers at and below umbilicus  Scar mobility: none  Rib flare: B SIJ Prone: L5  hypomobile and with increased pain with PPVIMs assessment Prone: R SIJ hypomobole more superiorly with pain at ligamentous structures surrounding SIJ Prone: L SIJ WNLs but more tenderness/pain at ligamentous structures surrounding SIJ   Increased lumbar lordosis in sidelying   Abdominal  More tenderness at central abdomen and increased myofascial tension felt upon palpation, may be due to feeling constipated    EXTERNAL PELVIC EXAM: Patient educated on the purpose of the pelvic exam and articulated understanding; patient consented to the exam verbally. Deferred 2/2 time constraints  Palpation: Breath coordination: present/absent/inconsistent Voluntary Contraction: present/absent Relaxation: full/delayed/non-relaxing Perineal movement with sustained IAP increase ("  bear down"): descent/no change/elevation/excessive descent Perineal movement with rapid IAP increase ("cough"): elevation/no change/descent Pubic symphysis: (0= no contraction, 1= flicker, 2= weak squeeze, 3= fair squeeze with lift, 4= good squeeze and lift against resistance, 5= strong squeeze against strong resistance)   TODAY'S TREATMENT  Manual Therapy: In R sidelying: STMs at L SIJ ligamentous structures and gluteal musculature for improved tissue extensibility and pain modulation  Initially sharp pain and then reports tenderness with continued manual intervention   STM at L superior attachment of hamstring - no pain but increased muscular tension noted   Neuromuscular Re-education: Seated diaphragmatic breathing with VCs and TCs for downregulation of the nervous system and pain modulation while sitting on heating modality during discussion and demonstration with daughter   Significant discussion and demonstration with DPT and Pt's eldest daughter on body mechanics/lifting mechanics in relation to maneuvering daughter's manual wheelchair in/out of the car First discussion on falls and using the gait belt to guide  "gentle fall" to the ground. Body mechanic of using a squat lunge and other UE to help descent daughter safely to the ground. Both verbalized understanding.  Practiced with manual w/c - being close to object to decrease torque placed on body and using breathing mechanics and squat positioning when lifting in/out of car - used raised bed  to mimic car height. Both verbalized understanding. Will continue to prioritize and practice these techniques next session as Pt with increased B gluteal pain   Discussion on postural stance and minimizing B gluteal activation when standing or during increased external stressors. Pt verbalized understanding.   Patient response to interventions: Pt with lessened sharp pain at the end of session.    Patient Education:  Patient provided with HEP including: practicing body mechanics discussed. Patient educated throughout session on appropriate technique and form using multi-modal cueing, HEP, and activity modification. Patient will benefit from further education in order to maximize compliance and understanding for long-term therapeutic gains.   ASSESSMENT:  Clinical Impression: Patient presents to clinic with excellent motivation to participate in today's session. Pt continues to demonstrate deficits IAP management, PFM coordination, LE strength, ROM, PFM extensibility, PFM strength, posture, and pain. Pt with increased B lower gluteal pain that has been present since last session. No falls reported. Upon palpation, Pt with increased tenderness and sharp pain at L gluteal structures. With continued manual intervention, a decrease in sharp pain reported. Rest of session focused on body mechanics/lifting mechanics in regards to all heavier objects but most importantly her daughter's ~43lb manual wheelchair. DPT demonstrated techniques with Pt's daughter as 2 people are always present when maneuvering the wheelchair. DPT will continue to practice techniques with patient  in upcoming sessions. Per Pt, she knows her body mechanics are "not great" while lifting the wheelchair. Pt responded positively to manual, active, and educational interventions and ended sessoin with lessened sharp pain at B gluteals. Patient will benefit from skilled therapeutic intervention to address deficits in IAP management, PFM coordination, LE strength, ROM, PFM extensibility, PFM strength, posture, and pain in order to increase PLOF and improve overall QOL.    Objective Impairments: decreased activity tolerance, decreased coordination, decreased strength, increased muscle spasms, improper body mechanics, postural dysfunction, and pain.   Activity Limitations: carrying, lifting, bending, standing, squatting, sleeping, continence, toileting, and caring for others  Personal Factors: Age, Past/current experiences, Time since onset of injury/illness/exacerbation, and 3+ comorbidities: anxiety/depression, insomnia, scoliosis  are also affecting patient's functional outcome.   Rehab Potential: Good  Clinical Decision Making:  Unstable/unpredictable  Evaluation Complexity: High   GOALS: Goals reviewed with patient? Yes  SHORT TERM GOALS: Target date: 12/23/2021  Patient will demonstrate independence with HEP in order to maximize therapeutic gains and improve carryover from physical therapy sessions to ADLs in the home and community. Baseline: toileting posture handout with demo of breathing Goal status: INITIAL   LONG TERM GOALS: Target date: 01/27/2022   Patient will decrease worst pain as reported on NPRS by at least 2 points to demonstrate clinically significant reduction in pain in order to restore/improve function and overall QOL. Baseline: 8/10 low back (ache/at times sharp) Goal status: INITIAL  2.  Patient will demonstrate coordinated lengthening and relaxation of PFM with diaphragmatic inhalation in order to decrease spasm and allow for unrestricted elimination of  urine/feces for improved overall QOL. Baseline: straining most of the time for BM (Type 1) and with Valsalva Goal status: INITIAL  3.  Patient will report being able to return to activities including, but not limited to: lifting, bending, squatting, pushing/pulling, increased physical activity, standing for long periods without pain or limitation to indicate complete resolution of the chief concern and return to prior level of participation at home and in the community. Baseline: LBP is increased by all the above and feels like she strained it  Goal status: INITIAL  4.  Patient will improve FOTO Lumbar Spine and FOTO Pelvic score by at least 8 points  in order to demonstrate decreased pain, improved PFM coordination, improved IAP management, and improved overall QOL.  Baseline: Lumbar -37 (47-goal), will assess pelvic next time Goal status: INITIAL  5.  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  Baseline: will practice in future sessions Goal status: INITIAL  6. Patient will report less than 5 incidents of stress urinary incontinence over the course of 3 weeks while coughing/lifting/bending/laughing/jumping in order to demonstrate improved PFM coordination, strength, and function for improved overall QOL. Baseline: urinary leakage with all the above and avoids jumping Goal status: INITIAL    PLAN: PT Frequency: 1x/week  PT Duration: 10 weeks  Planned Interventions: Therapeutic exercises, Therapeutic activity, Neuromuscular re-education, Balance training, Gait training, Patient/Family education, Self Care, Joint mobilization, Spinal mobilization, Cryotherapy, Moist heat, scar mobilization, Taping, and Manual therapy  Plan For Next Session:   STSs/lifting  techniques, (family session), PFM exam, pelvic tilts, begin deep core   Anothony Bursch, PT, DPT  01/13/2022, 12:08 PM

## 2022-01-21 ENCOUNTER — Ambulatory Visit: Payer: Medicaid Other

## 2022-01-28 ENCOUNTER — Ambulatory Visit: Payer: Medicaid Other

## 2022-02-03 ENCOUNTER — Ambulatory Visit: Payer: Medicaid Other

## 2022-02-04 ENCOUNTER — Ambulatory Visit: Payer: Medicaid Other

## 2022-02-10 ENCOUNTER — Ambulatory Visit: Payer: Medicaid Other | Attending: Sports Medicine | Admitting: Physical Therapy

## 2022-02-10 DIAGNOSIS — M6289 Other specified disorders of muscle: Secondary | ICD-10-CM

## 2022-02-10 DIAGNOSIS — M5459 Other low back pain: Secondary | ICD-10-CM

## 2022-02-10 DIAGNOSIS — R278 Other lack of coordination: Secondary | ICD-10-CM | POA: Diagnosis present

## 2022-02-10 NOTE — Therapy (Unsigned)
OUTPATIENT PHYSICAL THERAPY FEMALE PELVIC TREATMENT   Patient Name: Chelsea Carter MRN: [id number] DOB:[birth year], 47 y.o., female Today's Date: 01/13/2022   PT End of Session - 01/13/22 1106     Visit Number 6    Number of Visits 10    Date for PT Re-Evaluation 01/27/22    Authorization Type IE: 11/18/21    PT Start Time 1110    PT Stop Time 1155    PT Time Calculation (min) 45 min    Activity Tolerance Patient tolerated treatment well             Past Medical History:  Diagnosis Date   Anemia    Anxiety    Complication of anesthesia    did not get complete effects from spinal   Depression    Insomnia    Past Surgical History:  Procedure Laterality Date   BREAST BIOPSY Right 06/21/2019   [place] bx, venus marker, fibroadenoma   BREAST CYST ASPIRATION Right 08/27/2015   neg   BREAST EXCISIONAL BIOPSY Right 2013   fibroadenoma   heel spurs     LAPAROSCOPIC BILATERAL SALPINGECTOMY Bilateral 02/26/2019   Procedure: LAPAROSCOPIC BILATERAL SALPINGECTOMY;  Surgeon: Benjaman Kindler, MD;  Location: [facility];  Service: Gynecology;  Laterality: Bilateral;   LAPAROSCOPIC HYSTERECTOMY N/A 02/26/2019   Procedure: HYSTERECTOMY TOTAL LAPAROSCOPIC;  Surgeon: Benjaman Kindler, MD;  Location: [facility];  Service: Gynecology;  Laterality: N/A;   NASAL SINUS SURGERY     TUBAL LIGATION     Patient Active Problem List   Diagnosis Date Noted   Menorrhagia with irregular cycle 02/26/2019   Iron deficiency anemia due to chronic blood loss 12/07/2018    PCP: Christella Noa, MD  REFERRING PROVIDER: Diamond Nickel, DO   REFERRING DIAG:  M53.3 (ICD-10-CM) - SI (sacroiliac) joint dysfunction   THERAPY DIAG:  Other low back pain  Other lack of coordination  Pelvic floor dysfunction  Rationale for Evaluation and Treatment: Rehabilitation  ONSET DATE: "For awhile now"  PRECAUTIONS: None  WEIGHT BEARING RESTRICTIONS: No  FALLS:  Has patient fallen in last 6 months? Yes.  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 Pt is having lower back pain with no N/T at the posterior leg or down to the feet. This pain started when her husband was sick and fell landing on her bottom. Pt found herself doing more household chores and caregiving for both husband/daughter. Pt is not sleeping through the night and finds herself sleeping more at times. Pt gets about 3 hrs of sleep then is up for awhile and then falls asleep for 3-4 hrs. Pt has had an episode of anterior tingling down the legs which is what initially sent her to Dr. Candelaria Stagers. That has since resolved.   Pain location: lower back   Pain type: aching and sharp Pain description: intermittent, stabbing, tingling, and aching   Aggravating factors: lifting boxes, transfers, standing long periods  Relieving factors: rest, heat, ice, OTC   PATIENT GOALS: Pt will like to be able to lift things without hurting and be able to improve her BMs    SUBJECTIVE:   Pt reports her L sitting bone is  nagging and annoying 80% of the time with sitting in the car, chores.  Pain 5-6/10 right now. Non-radiating. Pt did not take pain medication.     OBJECTIVE:     TODAY'S TREATMENT   Patient Education:  Patient provided with HEP including: practicing body mechanics discussed. Patient educated throughout session on appropriate technique and form using multi-modal cueing, HEP, and activity modification. Patient will benefit from further education in order to maximize compliance and understanding for long-term therapeutic gains.   ASSESSMENT:  Clinical Impression:     Objective Impairments: decreased activity  tolerance, decreased coordination, decreased strength, increased muscle spasms, improper body mechanics, postural dysfunction, and pain.   Activity Limitations: carrying, lifting, bending, standing, squatting, sleeping, continence, toileting, and caring for others  Personal Factors: Age, Past/current experiences, Time since onset of injury/illness/exacerbation, and 3+ comorbidities: anxiety/depression, insomnia, scoliosis  are also affecting patient's functional outcome.   Rehab Potential: Good  Clinical Decision Making: Unstable/unpredictable  Evaluation Complexity: High   GOALS: Goals reviewed with patient? Yes  SHORT TERM GOALS: Target date: 12/23/2021  Patient will demonstrate independence with HEP in order to maximize therapeutic gains and improve carryover from physical therapy sessions to ADLs in the home and community. Baseline: toileting posture handout with demo of breathing Goal status:  MET    LONG TERM GOALS: Target date: 04/21/2022    Patient will decrease worst pain as reported on NPRS by at least 2 points to demonstrate clinically significant reduction in pain in order to restore/improve function and overall QOL. Baseline: 8/10 low back (ache/at times sharp)                  02/10/22:  7/10  Goal status: On going   2.  Patient will demonstrate coordinated lengthening and relaxation of PFM with diaphragmatic inhalation in order to decrease spasm and allow for unrestricted elimination of urine/feces for improved overall QOL. Baseline: straining most of the time for BM (Type 1) and with Valsalva   (02/10/22: still straining unless she takes Docalax)  Goal status:  On going   3.  Patient will report being able to return to activities including, but not limited to: lifting, bending, squatting, pushing/pulling, increased physical activity, standing for long periods without pain or limitation to indicate complete resolution of the chief concern and return to prior level of  participation at home and in the community. Baseline: LBP is increased by all the above and feels like she strained it  ( 02/10/22: no change)  Goal status:  On going   4.  Patient will improve FOTO Lumbar Spine and FOTO Pelvic score by at least 8 points  in order to demonstrate decreased pain, improved PFM coordination, improved IAP management, and improved overall QOL.  Baseline: Lumbar -37 (47-goal), will assess pelvic next time Goal status:On going   5.  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  Baseline: will practice in future sessions Goal status:  On going   6. Patient will report less than 5 incidents of stress urinary incontinence over the course of 3 weeks while coughing/lifting/bending/laughing/jumping in order to demonstrate improved PFM coordination, strength, and function for improved overall QOL. Baseline: urinary leakage with all the above and avoids jumping ( 02/10/22:  zero incidents of SUI)  Goal status: MET  7.  Pt will demo increased gait speed > 1.6 m/s in order to ambulate safely in community and return to fitness routine                          Baseline: 1.37 m/s , R pelvic sway, limited posterior rotation, limited L upper quadrant rotation                          Goal Status: NEW   PLAN: PT Frequency: 1x/week  PT Duration: 10 weeks  Planned Interventions: Therapeutic exercises, Therapeutic activity, Neuromuscular re-education, Balance training, Gait training, Patient/Family education, Self Care, Joint mobilization, Spinal mobilization, Cryotherapy, Moist heat, scar mobilization, Taping, and Manual therapy  Plan For Next Session:  see Clinical impression   Jerl Mina, PT. DPT   01/13/2022, 12:08 PM

## 2022-02-10 NOTE — Patient Instructions (Signed)
  Lengthen Back rib by L  shoulder ( winging)    Lie on R  side , pillow between knees and under head  Pull  L arm overhead over mattress, grab the edge of mattress,pull it upward, drawing elbow away from ears  Breathing 10 reps  Brushing arm with 3/4 turn onto pillow behind back  Lying on R  side ,Pillow/ Block between knees     dragging top forearm across ribs below breast rotating 3/4 turn,  rotating  _L_ only this week ,  relax onto the pillow behind the back  and then back to other palm , maintain top palm on body whole top and not lift shoulder  ___ 

## 2022-02-18 ENCOUNTER — Ambulatory Visit: Payer: Medicaid Other | Admitting: Physical Therapy

## 2022-02-18 DIAGNOSIS — M5459 Other low back pain: Secondary | ICD-10-CM

## 2022-02-18 DIAGNOSIS — M6289 Other specified disorders of muscle: Secondary | ICD-10-CM

## 2022-02-18 DIAGNOSIS — R278 Other lack of coordination: Secondary | ICD-10-CM

## 2022-02-18 NOTE — Therapy (Signed)
OUTPATIENT PHYSICAL THERAPY FEMALE PELVIC TREATMENT     Patient Name: Chelsea Carter MRN: [id number] DOB:1975-07-29, 47 y.o., female Today's Date: 02/19/2022   PT End of Session - 02/18/22 1553     Visit Number 8    Number of Visits 10    Date for PT Re-Evaluation 04/21/22    Authorization Type IE: 18/84/16, recert  6/0/63 , 0/16/01   PT Start Time 1551    PT Stop Time 1635   PT Time Calculation (min) 44 min    Activity Tolerance Patient tolerated treatment well             Past Medical History:  Diagnosis Date   Anemia    Anxiety    Complication of anesthesia    did not get complete effects from spinal   Depression    Insomnia    Past Surgical History:  Procedure Laterality Date   BREAST BIOPSY Right 06/21/2019   [place] bx, venus marker, fibroadenoma   BREAST CYST ASPIRATION Right 08/27/2015   neg   BREAST EXCISIONAL BIOPSY Right 2013   fibroadenoma   heel spurs     LAPAROSCOPIC BILATERAL SALPINGECTOMY Bilateral 02/26/2019   Procedure: LAPAROSCOPIC BILATERAL SALPINGECTOMY;  Surgeon: Benjaman Kindler, MD;  Location: [facility];  Service: Gynecology;  Laterality: Bilateral;   LAPAROSCOPIC HYSTERECTOMY N/A 02/26/2019   Procedure: HYSTERECTOMY TOTAL LAPAROSCOPIC;  Surgeon: Benjaman Kindler, MD;  Location: [facility];  Service: Gynecology;  Laterality: N/A;   NASAL SINUS SURGERY     TUBAL LIGATION     Patient Active Problem List   Diagnosis Date Noted   Menorrhagia with irregular cycle 02/26/2019   Iron deficiency anemia due to chronic blood loss 12/07/2018    PCP: Christella Noa, MD  REFERRING PROVIDER: Diamond Nickel, DO   REFERRING DIAG:  M53.3 (ICD-10-CM) - SI (sacroiliac) joint dysfunction   THERAPY DIAG:  Other low back pain  Other lack of coordination  Pelvic floor dysfunction  Rationale for Evaluation and Treatment: Rehabilitation  ONSET DATE: "For awhile now"  PRECAUTIONS: None  WEIGHT BEARING RESTRICTIONS: No  FALLS:  Has patient fallen  in last 6 months? Yes. 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 Pt is having lower back pain with no N/T at the posterior leg or down to the feet. This pain started when her husband was sick and fell landing on her bottom. Pt found herself doing more household chores and caregiving for both husband/daughter. Pt is not sleeping through the night and finds herself sleeping more at times. Pt gets about 3 hrs of sleep then is up for awhile and then falls asleep for 3-4 hrs. Pt has had an episode of anterior tingling down the legs which is what initially sent her to Dr. Candelaria Stagers. That has since resolved.   Pain location: lower back   Pain type: aching and sharp Pain description: intermittent, stabbing, tingling, and aching   Aggravating factors: lifting boxes, transfers, standing long periods  Relieving factors: rest, heat, ice, OTC   PATIENT GOALS: Pt will like to be able to lift things without hurting and be able to improve her BMs    SUBJECTIVE:   Pt reports  her pain moved to the R PSIS and the pain level was high enough she needed to take two types of pain medicine for 6 days. Pt did ice and heat for pain relief. Pt reports no pain today.     OBJECTIVE:     TODAY'S TREATMENT    [facility] PT Assessment - 02/18/22 1136       Coordination   Coordination and Movement Description Lowered L scapula and protraciton with shoulder adduction L ( post Tx: levelled scapula, less protraction), limited L lateral diaphramgatic excursion ( post Tx: improved)      Palpation   Spinal mobility L sideflexion/ R rotation with reproduction of pain at R PSIS ( no change post Tx)    SI  assessment  Levelled pelvic girdle, L shoulder lowered    Palpation comment hypermobility and tenderness T7, T12 to L, paraspinals, interspinals intercostals             [facility] Adult PT Treatment/Exercise - 02/18/22 1135       Neuro Re-ed    Neuro Re-ed Details  cued for stretches to improve pelvic and psinal alignment      Modalities   Modalities Moist Heat      Moist Heat Therapy   Number Minutes Moist Heat 5 Minutes   (unbilled)    Moist Heat Location --   thoracic , cervical     Manual Therapy   Manual therapy comments STM/MWM at problem areas noted in assessment to realign spine and pelvic girdle           Patient Education:  Patient provided with HEP including: practicing body mechanics discussed. Patient educated throughout session on appropriate technique and form using multi-modal cueing, HEP, and activity modification. Patient will benefit from further education in order to maximize compliance and understanding for long-term therapeutic gains.   ASSESSMENT:  Clinical Impression:  Pt has met 2/ 7 goals and continues to progress with remaining goals.  SUI has resolved. Currently focusing on realignment of pelvis and spine to minimize R  hip and LBP pain to return to ADL andd caregiving activities which require pushing and lifting.   After today's session, pt regained levelled pelvic alignment and less pain from 5/10 to 4/10 with L sideflexion. Plan to continue with manual Tx to further realign spinal deviations.   Plan to go over body mechanics with vaccuming and caregiving positions to minimize asymmetrical alignment of spine and pelvis. Plan to add deep core and multidifis strengthening at next sessions.   Pt continues to benefit from skilled PT.      Objective Impairments: decreased activity tolerance, decreased coordination, decreased strength, increased muscle spasms, improper body mechanics, postural dysfunction, and pain.   Activity Limitations: carrying,  lifting, bending, standing, squatting, sleeping, continence, toileting, and caring for others  Personal Factors: Age, Past/current experiences, Time since onset of injury/illness/exacerbation, and 3+ comorbidities: anxiety/depression, insomnia, scoliosis  are also affecting patient's functional outcome.   Rehab Potential: Good  Clinical Decision Making: Unstable/unpredictable  Evaluation Complexity: High   GOALS: Goals reviewed with patient? Yes  SHORT TERM GOALS: Target date: 12/23/2021  Patient will demonstrate independence with HEP in order to maximize therapeutic gains and improve carryover from physical therapy sessions to ADLs in the home and community. Baseline: toileting posture handout with demo of breathing Goal status:  MET    LONG TERM GOALS: Target date: 04/21/2022    Patient will decrease worst pain as reported on NPRS by at least 2 points to demonstrate  clinically significant reduction in pain in order to restore/improve function and overall QOL. Baseline: 8/10 low back (ache/at times sharp)                  02/10/22:  7/10  Goal status: On going   2.  Patient will demonstrate coordinated lengthening and relaxation of PFM with diaphragmatic inhalation in order to decrease spasm and allow for unrestricted elimination of urine/feces for improved overall QOL. Baseline: straining most of the time for BM (Type 1) and with Valsalva   (02/10/22: still straining unless she takes Docalax)  Goal status:  On going   3.  Patient will report being able to return to activities including, but not limited to: lifting, bending, squatting, pushing/pulling, increased physical activity, standing for long periods without pain or limitation to indicate complete resolution of the chief concern and return to prior level of participation at home and in the community. Baseline: LBP is increased by all the above and feels like she strained it  ( 02/10/22: no change)  Goal status:  On going   4.   Patient will improve FOTO Lumbar Spine and FOTO Pelvic score by at least 8 points  in order to demonstrate decreased pain, improved PFM coordination, improved IAP management, and improved overall QOL.  Baseline:  Lumbar -37 (47-goal) Pelvic score:  29 pt, ( 02/10/22: 32 pts   Goal status:On going   5.  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  Baseline: will practice in future sessions Goal status:  On going   6. Patient will report less than 5 incidents of stress urinary incontinence over the course of 3 weeks while coughing/lifting/bending/laughing/jumping in order to demonstrate improved PFM coordination, strength, and function for improved overall QOL. Baseline: urinary leakage with all the above and avoids jumping ( 02/10/22:  zero incidents of SUI)  Goal status: MET   7.  Pt will demo increased gait speed > 1.6 m/s in order to ambulate safely in community and return to fitness routine                          Baseline: 1.37 m/s , R pelvic sway,  limited posterior rotation, limited L upper quadrant rotation                          Goal Status: NEW     PLAN: PT Frequency: 1x/week  PT Duration: 10 weeks  Planned Interventions: Therapeutic exercises, Therapeutic activity, Neuromuscular re-education, Balance training, Gait training, Patient/Family education, Self Care, Joint mobilization, Spinal mobilization, Cryotherapy, Moist heat, scar mobilization, Taping, and Manual therapy  Plan For Next Session:  see Clinical impression   Mariane Masters, PT. DPT   02/19/2022, 11:38 AM

## 2022-02-18 NOTE — Patient Instructions (Signed)
  Avoid straining pelvic floor, abdominal muscles , spine  Use log rolling technique instead of getting out of bed with your neck or the sit-up     Log rolling into and out of bed   Log rolling into and out of bed If getting out of bed on R side, Bent knees, scoot hips/ shoulder to L  Raise R arm completely overhead, rolling onto armpit  Then lower bent knees to bed to get into complete side lying position  Then drop legs off bed, and push up onto R elbow/forearm, and use L hand to push onto the bed    Dig elbows and feet to lift hte buttocks and scoot without lifting head   ___  Deep core level 1 (handout)

## 2022-02-25 ENCOUNTER — Ambulatory Visit: Payer: Medicaid Other | Admitting: Physical Therapy

## 2022-03-04 ENCOUNTER — Ambulatory Visit: Payer: Medicaid Other | Admitting: Physical Therapy

## 2022-03-04 DIAGNOSIS — M5459 Other low back pain: Secondary | ICD-10-CM

## 2022-03-04 DIAGNOSIS — R278 Other lack of coordination: Secondary | ICD-10-CM

## 2022-03-04 DIAGNOSIS — M6289 Other specified disorders of muscle: Secondary | ICD-10-CM

## 2022-03-04 NOTE — Therapy (Signed)
OUTPATIENT PHYSICAL THERAPY FEMALE PELVIC TREATMENT   / Recert    Patient Name: Chelsea Carter MRN: [id number] DOB:1975/06/18, 47 y.o., female Today's Date: 02/19/2022    PT End of Session - 03/04/22 0854     Visit Number 9    Number of Visits 19    Date for PT Re-Evaluation 05/13/22    Authorization Type IE: 18/84/16, recert  6/0/63, 0/16/01    PT Start Time 0852    PT Stop Time 0940    PT Time Calculation (min) 48 min    Activity Tolerance Patient tolerated treatment well             Past Medical History:  Diagnosis Date   Anemia    Anxiety    Complication of anesthesia    did not get complete effects from spinal   Depression    Insomnia    Past Surgical History:  Procedure Laterality Date   BREAST BIOPSY Right 06/21/2019   [place] bx, venus marker, fibroadenoma   BREAST CYST ASPIRATION Right 08/27/2015   neg   BREAST EXCISIONAL BIOPSY Right 2013   fibroadenoma   heel spurs     LAPAROSCOPIC BILATERAL SALPINGECTOMY Bilateral 02/26/2019   Procedure: LAPAROSCOPIC BILATERAL SALPINGECTOMY;  Surgeon: Benjaman Kindler, MD;  Location: [facility];  Service: Gynecology;  Laterality: Bilateral;   LAPAROSCOPIC HYSTERECTOMY N/A 02/26/2019   Procedure: HYSTERECTOMY TOTAL LAPAROSCOPIC;  Surgeon: Benjaman Kindler, MD;  Location: [facility];  Service: Gynecology;  Laterality: N/A;   NASAL SINUS SURGERY     TUBAL LIGATION     Patient Active Problem List   Diagnosis Date Noted   Menorrhagia with irregular cycle 02/26/2019   Iron deficiency anemia due to chronic blood loss 12/07/2018    PCP: Christella Noa, MD  REFERRING PROVIDER: Diamond Nickel, DO   REFERRING DIAG:  M53.3 (ICD-10-CM) - SI (sacroiliac) joint dysfunction   THERAPY DIAG:  Other low back pain  Other lack of coordination  Pelvic floor dysfunction  Rationale for Evaluation and Treatment: Rehabilitation  ONSET DATE: "For awhile now"  PRECAUTIONS: None  WEIGHT BEARING RESTRICTIONS: No  FALLS:  Has  patient fallen in last 6 months? Yes. 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 Pt is having lower back pain with no N/T at the posterior leg or down to the feet. This pain started when her husband was sick and fell landing on her bottom. Pt found herself doing more household chores and caregiving for both husband/daughter. Pt is not sleeping through the night and finds herself sleeping more at times. Pt gets about 3 hrs of sleep then is up for awhile and then falls asleep for 3-4 hrs. Pt has had an episode of anterior tingling down the legs which is what initially sent her to Dr. Candelaria Stagers. That has since resolved.   Pain location: lower back   Pain type: aching and sharp Pain description: intermittent, stabbing, tingling, and aching   Aggravating factors: lifting boxes, transfers, standing long periods  Relieving factors: rest, heat, ice, OTC   PATIENT GOALS: Pt will like to be able to lift things without hurting and be able to improve her BMs  SUBJECTIVE:   Pt reports pain is shorter lasting when it occurs. The areas of pain is at the B PSIS areas and also L tailbone area is still sharp pain. They occur sporadically when sitting, standing, lying down, with bowel and urine elimination.    OBJECTIVE:    [facility] PT Assessment - 03/04/22 1036       Observation/Other Assessments   Observations crossed legs      Squat   Comments narrow BOS, anterior knee placement, lifting object far from COM      Sit to Stand   Comments genu valgus      Other:   Other/ Comments simulated t/f dtr with physical disabilities out of WC : poor alignment      Posture/Postural Control    Posture Comments no more rounded shoulder, wider BOS      Palpation   SI assessment  Levelled pelvic girdle, L shoulder less  lowered             [facility] Adult PT Treatment/Exercise - 03/04/22 1036       Neuro Re-ed    Neuro Re-ed Details  excessive cues for alignment,    Therapeutic Activities:               body mechanics training for chores and t/f dtr with special needs            Patient Education:  Patient provided with HEP including: practicing body mechanics discussed. Patient educated throughout session on appropriate technique and form using multi-modal cueing, HEP, and activity modification. Patient will benefit from further education in order to maximize compliance and understanding for long-term therapeutic gains.   ASSESSMENT:  Clinical Impression:  Pt has met 2/ 7 goals and continues to progress with remaining goals. Pt has made slow progress due to misalignment of misalignment of spine and pelvis. Pt now shows more levelled spine and shoulder which will help pt yield longer lasting benefits when pt learns strengthening at upcoming sessions. FOTO score improved only by 1-2 points for LUMBAR And PELVIC.   The areas of pain is at the B PSIS areas and also L tailbone area still have sharp pain. They occur sporadically when sitting, standing, lying down, with bowel and urine elimination. Pt still require more manual Tx and neuromuscular re-education and body mechanics training to help address these areas of deficits. Today, pt showed poor mechanics ( squats, sit to stand, lifting dtr from [facility]) which are related to her Sx. After training today, pt demo'd improved mechanics with simulated task related to transferring her dtr who has special needs/ physical needs  from [facility]. Pt also takes care of another child with special/ physical needs. Pt will need more strengthening to endure the load on her spine and pelvis with ADLs and these caregiving activities which require pushing  and lifting. Pt is now a single mother after her husband dies one year ago. Pt will benefit greatly from more skilled PT because her caregiving job and caring for her own dtr with special needs/ physical needs require her to build up more strength and postural /pelvic  stability to minimize radiating LBP, SIJ pain, and tailbone pain.    Pt also requires more pelvic floor assessment to address pain with bowel / urine elimination.   Plan to add deep core and multidifis strengthening at next sessions to minimize radiating LBP, SIJ pain, and tailbone pain.   Pt continues to benefit from skilled PT.      Objective  Impairments: decreased activity tolerance, decreased coordination, decreased strength, increased muscle spasms, improper body mechanics, postural dysfunction, and pain.   Activity Limitations: carrying, lifting, bending, standing, squatting, sleeping, continence, toileting, and caring for others  Personal Factors: Age, Past/current experiences, Time since onset of injury/illness/exacerbation, and 3+ comorbidities: anxiety/depression, insomnia, scoliosis  are also affecting patient's functional outcome.   Rehab Potential: Good  Clinical Decision Making: Unstable/unpredictable  Evaluation Complexity: High   GOALS: Goals reviewed with patient? Yes  SHORT TERM GOALS: Target date: 12/23/2021  Patient will demonstrate independence with HEP in order to maximize therapeutic gains and improve carryover from physical therapy sessions to ADLs in the home and community. Baseline: toileting posture handout with demo of breathing Goal status:  MET    LONG TERM GOALS: Target date: 04/21/2022    Patient will decrease worst pain as reported on NPRS by at least 2 points to demonstrate clinically significant reduction in pain in order to restore/improve function and overall QOL. Baseline: 8/10 low back (ache/at times sharp)                  02/10/22:  7/10                 03/04/22:  5/10  Goal  status: MET   2.  Patient will demonstrate coordinated lengthening and relaxation of PFM with diaphragmatic inhalation in order to decrease spasm and allow for unrestricted elimination of urine/feces for improved overall QOL. Baseline: straining most of the time for BM (Type 1) and with Valsalva   (02/10/22 and 03/04/22: still straining unless she takes Docalax)  Goal status:  On going   3.  Patient will report being able to return to activities including, but not limited to: lifting, bending, squatting, pushing/pulling, increased physical activity, standing for long periods without pain or limitation to indicate complete resolution of the chief concern and return to prior level of participation at home and in the community. Baseline: LBP is increased by all the above and feels like she strained it  ( 02/10/22: no change) (03/04/22: carry grocery, light loads, do chores but not able to pull WC)    Goal status:  On going   4.  Patient will improve FOTO Lumbar Spine and FOTO Pelvic score by at least 8 points  in order to demonstrate decreased pain, improved PFM coordination, improved IAP management, and improved overall QOL.  Baseline:  Lumbar -37 (47-goal)  ( 03/04/22: 38 pts)  Pelvic score:  29 pt, ( 02/10/22: 32 pts)                Goal status:On going   5.  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  Baseline: Poor alignment and techniques in squat and simulated task t/f with dtr ( 03/04/22)  Goal status:  On going   6. Patient will report less than 5 incidents of stress urinary incontinence over the course of 3 weeks while coughing/lifting/bending/laughing/jumping in order to demonstrate improved PFM coordination, strength, and function for improved overall  QOL. Baseline: urinary leakage with all the above and avoids jumping ( 02/10/22:  zero incidents of SUI)  Goal status: MET   7.  Pt will demo increased gait speed > 1.6 m/s in order to ambulate safely in community and return to fitness  routine                          Baseline: 1.37 m/s , R pelvic sway,  limited posterior rotation, limited L upper quadrant rotation                          Goal Status: NEW      PLAN: PT Frequency: 1x/week  PT Duration: 10 weeks  Planned Interventions: Therapeutic exercises, Therapeutic activity, Neuromuscular re-education, Balance training, Gait training, Patient/Family education, Self Care, Joint mobilization, Spinal mobilization, Cryotherapy, Moist heat, scar mobilization, Taping, and Manual therapy  Plan For Next Session:  see Clinical impression   Mariane Masters, PT. DPT   02/19/2022, 11:38 AM

## 2022-03-04 NOTE — Patient Instructions (Addendum)
Body mechanics training:  Lifting daughter out of WC  ski-track standing (one foot forward, shift weight into back leg as you lift)    Stand at an angle to keep her close to your center   __   Proper body mechanics with getting out of a chair to decrease strain  on back &pelvic floor   Avoid holding your breath when Getting out of the chair:  Scoot to front part of chair chair Heels behind knees, feet are hip width apart, nose over toes  Inhale like you are smelling roses Exhale to stand   __  Handout chores    ___  Minisquat: Scoot buttocks back slight, hinge like you are looking at your reflection on a pond  Knees behind toes,  Inhale to "smell flowers"  Exhale on the rise "like rocket"  Do not lock knees, have more weight across ballmounds of feet, toes relaxed and spread them, not grip them   10 reps x 3 x day

## 2022-03-18 ENCOUNTER — Ambulatory Visit: Payer: Medicaid Other | Admitting: Physical Therapy

## 2022-03-26 ENCOUNTER — Ambulatory Visit: Payer: Medicaid Other | Attending: Sports Medicine | Admitting: Physical Therapy

## 2022-03-26 ENCOUNTER — Telehealth: Payer: Self-pay | Admitting: Physical Therapy

## 2022-03-26 NOTE — Telephone Encounter (Signed)
Therapist left voice mail about no show appt and explained no more coverage for [facility] for this POC. Provided email if pt had questions about resources for PT. Encouraged pt to continue with deep core HEP, neck and shoulder strengthening, squats. If pt needed more PT in the future, a new order will be placed and submit for [facility] again.

## 2022-04-02 ENCOUNTER — Ambulatory Visit: Payer: Medicaid Other | Admitting: Physical Therapy

## 2022-04-06 ENCOUNTER — Encounter: Payer: Medicaid Other | Admitting: Physical Therapy

## 2022-04-13 ENCOUNTER — Encounter: Payer: Medicaid Other | Admitting: Physical Therapy

## 2022-04-20 ENCOUNTER — Encounter: Payer: Medicaid Other | Admitting: Physical Therapy

## 2022-04-27 ENCOUNTER — Encounter: Payer: Medicaid Other | Admitting: Physical Therapy

## 2022-11-03 ENCOUNTER — Other Ambulatory Visit: Payer: Self-pay | Admitting: Family Medicine

## 2022-11-03 DIAGNOSIS — Z1231 Encounter for screening mammogram for malignant neoplasm of breast: Secondary | ICD-10-CM

## 2023-05-03 ENCOUNTER — Other Ambulatory Visit: Payer: Self-pay | Admitting: Family Medicine

## 2023-05-03 DIAGNOSIS — Z1231 Encounter for screening mammogram for malignant neoplasm of breast: Secondary | ICD-10-CM

## 2023-05-13 ENCOUNTER — Encounter

## 2023-05-25 ENCOUNTER — Encounter

## 2023-06-02 ENCOUNTER — Ambulatory Visit
Admission: RE | Admit: 2023-06-02 | Discharge: 2023-06-02 | Disposition: A | Discharge status: Home / Self Care | Source: Ambulatory Visit | Attending: Family Medicine | Admitting: Family Medicine

## 2023-06-02 DIAGNOSIS — Z1231 Encounter for screening mammogram for malignant neoplasm of breast: Secondary | ICD-10-CM | POA: Diagnosis present

## 2023-06-07 ENCOUNTER — Encounter: Payer: Self-pay | Admitting: *Deleted

## 2023-06-07 ENCOUNTER — Other Ambulatory Visit: Payer: Self-pay

## 2023-06-07 DIAGNOSIS — D649 Anemia, unspecified: Secondary | ICD-10-CM | POA: Insufficient documentation

## 2023-06-07 DIAGNOSIS — E876 Hypokalemia: Secondary | ICD-10-CM | POA: Diagnosis not present

## 2023-06-07 DIAGNOSIS — F19239 Other psychoactive substance dependence with withdrawal, unspecified: Secondary | ICD-10-CM | POA: Diagnosis not present

## 2023-06-07 DIAGNOSIS — R42 Dizziness and giddiness: Secondary | ICD-10-CM | POA: Diagnosis present

## 2023-06-07 LAB — GROUP A STREP BY PCR: Group A Strep by PCR: NOT DETECTED

## 2023-06-07 NOTE — ED Triage Notes (Addendum)
 Pt to triage via wheelchair.  Pt reports chills, sore throat, cough.  Pt also states sense of taste is altered.  Pt refused resp swab at this time.  Pt states I don't have covid.  Swab obtained for strep test.  Sx for several days.   Pt tearful in triage.  Pt alert  speech clear.

## 2023-06-07 NOTE — ED Notes (Signed)
 This tech tried to obtain covid/flu/Rsv swab and pt refused. Pt did do the  strep swab.

## 2023-06-08 ENCOUNTER — Emergency Department

## 2023-06-08 ENCOUNTER — Emergency Department
Admission: EM | Admit: 2023-06-08 | Discharge: 2023-06-08 | Disposition: A | Discharge status: Home / Self Care | Attending: Emergency Medicine | Admitting: Emergency Medicine

## 2023-06-08 DIAGNOSIS — R42 Dizziness and giddiness: Secondary | ICD-10-CM

## 2023-06-08 DIAGNOSIS — F13939 Sedative, hypnotic or anxiolytic use, unspecified with withdrawal, unspecified: Secondary | ICD-10-CM

## 2023-06-08 LAB — COMPREHENSIVE METABOLIC PANEL WITH GFR
ALT: 14 U/L (ref 0–44)
AST: 16 U/L (ref 15–41)
Albumin: 4 g/dL (ref 3.5–5.0)
Alkaline Phosphatase: 45 U/L (ref 38–126)
Anion gap: 7 (ref 5–15)
BUN: 12 mg/dL (ref 6–20)
CO2: 25 mmol/L (ref 22–32)
Calcium: 9.7 mg/dL (ref 8.9–10.3)
Chloride: 106 mmol/L (ref 98–111)
Creatinine, Ser: 0.84 mg/dL (ref 0.44–1.00)
GFR, Estimated: >60 mL/min (ref >60)
Glucose, Bld: 133 mg/dL — ABNORMAL HIGH (ref 70–99)
Potassium: 3.3 mmol/L — ABNORMAL LOW (ref 3.5–5.1)
Sodium: 138 mmol/L (ref 135–145)
Total Bilirubin: 0.5 mg/dL (ref 0.0–1.2)
Total Protein: 7.5 g/dL (ref 6.5–8.1)

## 2023-06-08 LAB — CBC WITH DIFFERENTIAL/PLATELET
Abs Immature Granulocytes: 0.02 10*3/uL (ref 0.00–0.07)
Basophils Absolute: 0.1 10*3/uL (ref 0.0–0.1)
Basophils Relative: 1 %
Eosinophils Absolute: 0.1 10*3/uL (ref 0.0–0.5)
Eosinophils Relative: 1 %
HCT: 32.9 % — ABNORMAL LOW (ref 36.0–46.0)
Hemoglobin: 10.9 g/dL — ABNORMAL LOW (ref 12.0–15.0)
Immature Granulocytes: 0 %
Lymphocytes Relative: 28 %
Lymphs Abs: 2 10*3/uL (ref 0.7–4.0)
MCH: 30.1 pg (ref 26.0–34.0)
MCHC: 33.1 g/dL (ref 30.0–36.0)
MCV: 90.9 fL (ref 80.0–100.0)
Monocytes Absolute: 0.7 10*3/uL (ref 0.1–1.0)
Monocytes Relative: 9 %
Neutro Abs: 4.4 10*3/uL (ref 1.7–7.7)
Neutrophils Relative %: 61 %
Platelets: 239 10*3/uL (ref 150–400)
RBC: 3.62 MIL/uL — ABNORMAL LOW (ref 3.87–5.11)
RDW: 12.4 % (ref 11.5–15.5)
WBC: 7.1 10*3/uL (ref 4.0–10.5)
nRBC: 0 % (ref 0.0–0.2)

## 2023-06-08 LAB — URINALYSIS, ROUTINE W REFLEX MICROSCOPIC
Bilirubin Urine: NEGATIVE
Glucose, UA: NEGATIVE mg/dL
Ketones, ur: NEGATIVE mg/dL
Leukocytes,Ua: NEGATIVE
Nitrite: NEGATIVE
Protein, ur: NEGATIVE mg/dL
Specific Gravity, Urine: 1.002 — ABNORMAL LOW (ref 1.005–1.030)
pH: 6 (ref 5.0–8.0)

## 2023-06-08 LAB — MAGNESIUM: Magnesium: 1.9 mg/dL (ref 1.7–2.4)

## 2023-06-08 LAB — TSH: TSH: 0.848 u[IU]/mL (ref 0.350–4.500)

## 2023-06-08 MED ORDER — MECLIZINE HCL 25 MG PO TABS
25.0000 mg | ORAL_TABLET | Freq: Once | ORAL | Status: AC
Start: 2023-06-08 — End: 2023-06-08
  Administered 2023-06-08: 25 mg via ORAL
  Filled 2023-06-08: qty 1

## 2023-06-08 MED ORDER — CLONAZEPAM 0.5 MG PO TABS
1.0000 mg | ORAL_TABLET | Freq: Once | ORAL | Status: AC
  Administered 2023-06-08: 1 mg via ORAL
  Filled 2023-06-08: qty 2

## 2023-06-08 MED ORDER — MECLIZINE HCL 25 MG PO TABS: 25.0000 mg | ORAL_TABLET | Freq: Three times a day (TID) | ORAL | 0 refills | Status: AC | PRN

## 2023-06-08 MED ORDER — POTASSIUM CHLORIDE CRYS ER 20 MEQ PO TBCR
40.0000 meq | EXTENDED_RELEASE_TABLET | Freq: Once | ORAL | Status: AC
  Administered 2023-06-08: 40 meq via ORAL
  Filled 2023-06-08: qty 2

## 2023-06-08 MED ORDER — SODIUM CHLORIDE 0.9 % IV BOLUS (SEPSIS)
1000.0000 mL | Freq: Once | INTRAVENOUS | Status: AC
  Administered 2023-06-08: 1000 mL via INTRAVENOUS

## 2023-06-08 MED ORDER — KETOROLAC TROMETHAMINE 30 MG/ML IJ SOLN
30.0000 mg | Freq: Once | INTRAMUSCULAR | Status: AC
  Administered 2023-06-08: 30 mg via INTRAVENOUS
  Filled 2023-06-08: qty 1

## 2023-06-08 MED ORDER — LORAZEPAM 2 MG/ML IJ SOLN
1.0000 mg | Freq: Once | INTRAMUSCULAR | Status: AC
  Administered 2023-06-08: 1 mg via INTRAVENOUS
  Filled 2023-06-08: qty 1

## 2023-06-08 MED ORDER — CLONAZEPAM 1 MG PO TABS: 1.0000 mg | ORAL_TABLET | Freq: Three times a day (TID) | ORAL | 0 refills | Status: AC | PRN

## 2023-06-08 NOTE — Discharge Instructions (Addendum)
You may alternate Tylenol 1000 mg every 6 hours as needed for pain, fever and Ibuprofen 800 mg every 6-8 hours as needed for pain, fever.  Please take Ibuprofen with food.  Do not take more than 4000 mg of Tylenol (acetaminophen) in a 24 hour period. ° °

## 2023-06-08 NOTE — ED Provider Notes (Signed)
 [facility] Provider Note    Event Date/Time   First MD Initiated Contact with Patient 06/08/23 0018     (approximate)   History   Chills, Headache, and Dizziness   HPI  Chelsea Carter is a 48 y.o. female with history of iron  deficiency anemia, depression and anxiety on clonazepam  1 mg 3 times a day, insomnia on Restoril  15 mg at night who presents to the emergency department with complaints of not feeling like herself.  Reports she has had fullness in her ears that has improved, vertigo worse with changing in position, feeling like her tongue is swollen, sore throat, anterior cervical lymphadenopathy and that has resolved, brain fog, muscle twitching, frontal headaches, chills.  No head injury.  No numbness, tingling or focal weakness.  No chest pain or shortness of breath.  No fevers, cough or congestion.  No vomiting or diarrhea.  No urinary symptoms.  She has an appointment to see her PCP on Monday, May 5.  She reports she has been without her clonazepam  for the past week.   History provided by patient, daughter.    Past Medical History:  Diagnosis Date   Anemia    Anxiety    Complication of anesthesia    did not get complete effects from spinal   Depression    Insomnia     Past Surgical History:  Procedure Laterality Date   BREAST BIOPSY Right 06/21/2019   us  bx, venus marker, fibroadenoma   BREAST CYST ASPIRATION Right 08/27/2015   neg   BREAST EXCISIONAL BIOPSY Right 2013   fibroadenoma   heel spurs     LAPAROSCOPIC BILATERAL SALPINGECTOMY Bilateral 02/26/2019   Procedure: LAPAROSCOPIC BILATERAL SALPINGECTOMY;  Surgeon: Prescilla Brod, MD;  Location: [facility] ORS;  Service: Gynecology;  Laterality: Bilateral;   LAPAROSCOPIC HYSTERECTOMY N/A 02/26/2019   Procedure: HYSTERECTOMY TOTAL LAPAROSCOPIC;  Surgeon: Prescilla Brod, MD;  Location: [facility] OR[facility];  Service: Gynecology;  Laterality: N/A;   NASAL SINUS SURGERY     TUBAL LIGATION       MEDICATIONS:  Prior to Admission medications   Medication Sig Start Date End Date Taking? Authorizing Provider  bisacodyl  (DULCOLAX) 5 MG EC tablet Take 10 mg by mouth daily. Patient not taking: Reported on 11/18/2021    [provider]  buPROPion  (WELLBUTRIN  XL) 300 MG 24 hr tablet Take 300 mg by mouth 2 (two) times daily.  12/04/18   [provider]  clonazePAM  (KLONOPIN ) 0.5 MG tablet Take 0.5 mg by mouth 2 (two) times daily.     [provider]  docusate sodium  (COLACE) 100 MG capsule Take 1 capsule (100 mg total) by mouth 2 (two) times daily. To keep stools soft Patient not taking: Reported on 11/18/2021 02/27/19   Prescilla Brod, MD  docusate sodium  (COLACE) 100 MG capsule Take 1 capsule (100 mg total) by mouth 2 (two) times daily. To keep stools soft Patient not taking: Reported on 11/18/2021 02/27/19   Prescilla Brod, MD  escitalopram  (LEXAPRO ) 20 MG tablet Take 20 mg by mouth every evening.     [provider]  fluconazole (DIFLUCAN) 150 MG tablet Take 150 mg by mouth 2 (two) times daily as needed. Patient not taking: Reported on 11/18/2021 08/10/19   [provider]  gabapentin  (NEURONTIN ) 800 MG tablet Take 1 tablet (800 mg total) by mouth at bedtime for 14 days. Take nightly for 3 days, then up to 14 days as needed 02/27/19 03/13/19  Prescilla Brod, MD  gabapentin  (  NEURONTIN ) 800 MG tablet Take 1 tablet (800 mg total) by mouth at bedtime for 14 days. Take nightly for 3 days, then up to 14 days as needed 02/27/19 03/13/19  Prescilla Brod, MD  Melatonin 5 MG TABS Take 5 mg by mouth at bedtime as needed (sleep). Patient not taking: Reported on 11/18/2021    [provider]  oxycodone  (OXY-IR) 5 MG capsule Take 1 capsule (5 mg total) by mouth every 6 (six) hours as needed for pain. Patient not taking: Reported on 11/18/2021 02/27/19   Prescilla Brod, MD  oxycodone  (OXY-IR) 5 MG capsule Take 1 capsule (5 mg total) by mouth every 6  (six) hours as needed for pain. Patient not taking: Reported on 11/18/2021 02/27/19   Prescilla Brod, MD  temazepam  (RESTORIL ) 15 MG capsule 15 mg at bedtime.  03/17/18   [provider]    Physical Exam   Triage Vital Signs: ED Triage Vitals  Encounter Vitals Group     BP 06/07/23 2257 (!) 127/94     Systolic BP Percentile --      Diastolic BP Percentile --      Pulse Rate 06/07/23 2257 (!) 115     Resp 06/07/23 2257 18     Temp 06/07/23 2257 97.6 F (36.4 C)     Temp Source 06/07/23 2257 Oral     SpO2 06/07/23 2257 100 %     Weight 06/07/23 2258 145 lb (65.8 kg)     Height 06/07/23 2258 5\' 7"  (1.702 m)     Head Circumference --      Peak Flow --      Pain Score 06/07/23 2257 0     Pain Loc --      Pain Education --      Exclude from Growth Chart --     Most recent vital signs: Vitals:   06/08/23 0200 06/08/23 0450  BP: 116/70 109/74  Pulse: 68 66  Resp: 18 16  Temp:    SpO2: 100% 100%    CONSTITUTIONAL: Alert, responds appropriately to questions. Well-appearing; well-nourished HEAD: Normocephalic, atraumatic EYES: Conjunctivae clear, pupils appear equal, sclera nonicteric ENT: normal nose; moist mucous membranes; No pharyngeal erythema or petechiae, no tonsillar hypertrophy or exudate, no uvular deviation, no unilateral swelling in posterior oropharynx, no trismus or drooling, no muffled voice, normal phonation, no stridor, airway patent.  TMs are clear bilaterally without erythema, purulence, bulging, perforation, effusion.  No cerumen impaction or sign of foreign body in the external auditory canal. No inflammation, erythema or drainage from the external auditory canal. No signs of mastoiditis. No pain with manipulation of the pinna bilaterally. NECK: Supple, normal ROM, no thyromegaly, no cervical lymphadenopathy CARD: RRR; S1 and S2 appreciated RESP: Normal chest excursion without splinting or tachypnea; breath sounds clear and equal bilaterally; no wheezes,  no rhonchi, no rales, no hypoxia or respiratory distress, speaking full sentences ABD/GI: Non-distended; soft, non-tender, no rebound, no guarding, no peritoneal signs BACK: The back appears normal EXT: Normal ROM in all joints; no deformity noted, no edema SKIN: Normal color for age and race; warm; no rash on exposed skin NEURO: Moves all extremities equally, normal speech, no nystagmus, normal sensation diffusely, cranial nerves II through XII intact PSYCH: The patient's mood and manner are appropriate.   ED Results / Procedures / Treatments   LABS: (all labs ordered are listed, but only abnormal results are displayed) Labs Reviewed  CBC WITH DIFFERENTIAL/PLATELET - Abnormal; Notable for the following components:  Result Value   RBC 3.62 (*)    Hemoglobin 10.9 (*)    HCT 32.9 (*)    All other components within normal limits  COMPREHENSIVE METABOLIC PANEL WITH GFR - Abnormal; Notable for the following components:   Potassium 3.3 (*)    Glucose, Bld 133 (*)    All other components within normal limits  URINALYSIS, ROUTINE W REFLEX MICROSCOPIC - Abnormal; Notable for the following components:   Color, Urine STRAW (*)    APPearance CLEAR (*)    Specific Gravity, Urine 1.002 (*)    Hgb urine dipstick SMALL (*)    Bacteria, UA FEW (*)    All other components within normal limits  GROUP A STREP BY PCR  MAGNESIUM  TSH     EKG:  EKG Interpretation Date/Time:  Wednesday June 08 2023 01:19:02 EDT Ventricular Rate:  62 PR Interval:  182 QRS Duration:  74 QT Interval:  398 QTC Calculation: 405 R Axis:   69  Text Interpretation: Sinus rhythm Confirmed by Verneda Golder [phone]) on 06/08/2023 1:32:22 AM         RADIOLOGY: My personal review and interpretation of imaging: CT head unremarkable.  I have personally reviewed all radiology reports.   CT HEAD WO CONTRAST ( ) Result Date: 06/08/2023 EXAM: CT HEAD WITHOUT 06/08/2023 01:01:52 AM TECHNIQUE: CT of the head was  performed without the administration of intravenous contrast. Automated exposure control, iterative reconstruction, and/or weight based adjustment of the mA/kV was utilized to reduce the radiation dose to as low as reasonably achievable. COMPARISON: None available. CLINICAL HISTORY: Headache, increasing frequency or severity. Pt to triage via wheelchair. Pt reports chills, sore throat, cough. Pt also states sense of taste is altered. Pt refused respiratory swab at this time. Pt states "I don't have COVID." Swab obtained for strep test. Symptoms for several days. FINDINGS: BRAIN AND VENTRICLES: There is no acute intracranial hemorrhage, mass effect or midline shift. No abnormal extra-axial fluid collection. The gray-white differentiation is maintained without evidence of an acute infarct. There is no evidence of hydrocephalus. ORBITS: The visualized portion of the orbits demonstrate no acute abnormality. SINUSES: The visualized paranasal sinuses and mastoid air cells demonstrate no acute abnormality. SOFT TISSUES AND SKULL: No acute abnormality of the visualized skull or soft tissues. IMPRESSION: 1. No acute intracranial abnormality. Electronically signed by: Zadie Herter MD 06/08/2023 01:16 AM EDT RP Workstation: [id number]     PROCEDURES:  Critical Care performed: No     Procedures    IMPRESSION / MDM / ASSESSMENT AND PLAN / ED COURSE  I reviewed the triage vital signs and the nursing notes.    Patient here for multiple complaints.  Has been without clonazepam  for the past week.  The patient is on the cardiac monitor to evaluate for evidence of arrhythmia and/or significant heart rate changes.   DIFFERENTIAL DIAGNOSIS (includes but not limited to):   Anemia, electrolyte derangement, thyroid  dysfunction, UTI, less likely intracranial hemorrhage or stroke.  Suspect large component of this is clonazepam  withdrawal.  Could also be peripheral vertigo.   Patient's presentation is most  consistent with acute presentation with potential threat to life or bodily function.   PLAN: Will obtain labs, urine, CT head.  Will give IV fluids, meclizine, Toradol  for symptomatic relief and a dose of her Klonopin .   MEDICATIONS GIVEN IN ED: Medications  sodium chloride  0.9 % bolus 1,000 mL (0 mLs Intravenous Stopped 06/08/23 0143)  meclizine (ANTIVERT) tablet 25 mg (25 mg Oral Given 06/08/23  0113)  ketorolac  (TORADOL ) 30 MG/ML injection 30 mg (30 mg Intravenous Given 06/08/23 0112)  clonazePAM  (KLONOPIN ) tablet 1 mg (1 mg Oral Given 06/08/23 0113)  potassium chloride SA (KLOR-CON M) CR tablet 40 mEq (40 mEq Oral Given 06/08/23 0243)  LORazepam (ATIVAN) injection 1 mg (1 mg Intravenous Given 06/08/23 0251)     ED COURSE: Labs show hemoglobin of 10.9.  Potassium of 3.3.  Given oral replacement.  Normal thyroid  function.  CT head reviewed and interpreted by myself and the radiologist as unremarkable.  Urine shows no sign of infection or dehydration.  Patient reports no significant improvement with IV fluids and meclizine with her vertigo.  Will give Ativan and reassess.   Patient given Ativan.  Reports feeling better.  Ambulates with steady gait around the emergency department.  Tolerating p.o. here.  Will discharge with prescription of meclizine and ENT follow-up.  Will also give refill of her clonazepam  until she can see her PCP in 5 days.  Patient and daughter comfortable with this plan.   At this time, I do not feel there is any life-threatening condition present. I reviewed all nursing notes, vitals, pertinent previous records.  All lab and urine results, EKGs, imaging ordered have been independently reviewed and interpreted by myself.  I reviewed all available radiology reports from any imaging ordered this visit.  Based on my assessment, I feel the patient is safe to be discharged home without further emergent workup and can continue workup as an outpatient as needed. Discussed all  findings, treatment plan as well as usual and customary return precautions.  They verbalize understanding and are comfortable with this plan.  Outpatient follow-up has been provided as needed.  All questions have been answered.    CONSULTS:  none   OUTSIDE RECORDS REVIEWED: Reviewed last orthopedic note in September 2024.       FINAL CLINICAL IMPRESSION(S) / ED DIAGNOSES   Final diagnoses:  Vertigo  Benzodiazepine withdrawal, with unspecified complication (HCC)     Rx / DC Orders   ED Discharge Orders          Ordered    clonazePAM  (KLONOPIN ) 1 MG tablet  3 times daily PRN        06/08/23 0412    meclizine (ANTIVERT) 25 MG tablet  3 times daily PRN        06/08/23 4098             Note:  This document was prepared using Dragon voice recognition software and may include unintentional dictation errors.   Ksean Vale, Clover Dao, DO 06/08/23 [phone]

## 2023-12-30 ENCOUNTER — Other Ambulatory Visit: Payer: Self-pay | Admitting: Family Medicine

## 2023-12-30 DIAGNOSIS — N6320 Unspecified lump in the left breast, unspecified quadrant: Secondary | ICD-10-CM

## 2024-01-12 ENCOUNTER — Encounter: Payer: Self-pay | Admitting: Oncology

## 2024-01-12 ENCOUNTER — Other Ambulatory Visit: Payer: Self-pay | Admitting: Family Medicine

## 2024-01-12 DIAGNOSIS — N63 Unspecified lump in unspecified breast: Secondary | ICD-10-CM

## 2024-01-20 ENCOUNTER — Inpatient Hospital Stay: Admission: RE | Admit: 2024-01-20

## 2024-02-08 ENCOUNTER — Encounter: Payer: Self-pay | Admitting: Oncology

## 2024-02-08 ENCOUNTER — Ambulatory Visit
Admission: RE | Admit: 2024-02-08 | Discharge: 2024-02-08 | Disposition: A | Discharge status: Home / Self Care | Payer: MEDICAID | Source: Ambulatory Visit | Attending: Family Medicine | Admitting: Family Medicine

## 2024-02-08 DIAGNOSIS — N63 Unspecified lump in unspecified breast: Secondary | ICD-10-CM

## 2024-02-16 ENCOUNTER — Other Ambulatory Visit: Payer: Self-pay | Admitting: Family Medicine

## 2024-02-16 DIAGNOSIS — R928 Other abnormal and inconclusive findings on diagnostic imaging of breast: Secondary | ICD-10-CM

## 2024-02-22 ENCOUNTER — Inpatient Hospital Stay: Admission: RE | Admit: 2024-02-22 | Payer: MEDICAID

## 2024-02-29 ENCOUNTER — Inpatient Hospital Stay: Admission: RE | Admit: 2024-02-29 | Payer: MEDICAID | Source: Ambulatory Visit

## 2024-03-20 ENCOUNTER — Other Ambulatory Visit: Payer: MEDICAID
